# Patient Record
Sex: Female | Born: 1960
Health system: Southern US, Community
[De-identification: ages and names within clinical notes are randomized; demographics above are authoritative.]

## PROBLEM LIST (undated history)

## (undated) DIAGNOSIS — E119 Type 2 diabetes mellitus without complications: Secondary | ICD-10-CM

## (undated) DIAGNOSIS — F319 Bipolar disorder, unspecified: Secondary | ICD-10-CM

## (undated) DIAGNOSIS — I10 Essential (primary) hypertension: Secondary | ICD-10-CM

---

## 1997-09-18 ENCOUNTER — Emergency Department (HOSPITAL_COMMUNITY): Admission: EM | Admit: 1997-09-18 | Discharge: 1997-09-18 | Payer: Self-pay | Admitting: Emergency Medicine

## 1997-12-12 ENCOUNTER — Emergency Department (HOSPITAL_COMMUNITY): Admission: EM | Admit: 1997-12-12 | Discharge: 1997-12-12 | Payer: Self-pay | Admitting: Emergency Medicine

## 1997-12-21 ENCOUNTER — Encounter: Admission: RE | Admit: 1997-12-21 | Discharge: 1998-03-21 | Payer: Self-pay

## 1998-03-29 ENCOUNTER — Emergency Department (HOSPITAL_COMMUNITY): Admission: EM | Admit: 1998-03-29 | Discharge: 1998-03-29 | Payer: Self-pay | Admitting: Emergency Medicine

## 1998-03-29 ENCOUNTER — Encounter: Payer: Self-pay | Admitting: Emergency Medicine

## 1998-11-15 ENCOUNTER — Encounter: Admission: RE | Admit: 1998-11-15 | Discharge: 1998-11-15 | Payer: Self-pay | Admitting: Obstetrics

## 1998-11-15 ENCOUNTER — Other Ambulatory Visit: Admission: RE | Admit: 1998-11-15 | Discharge: 1998-11-15 | Payer: Self-pay | Admitting: *Deleted

## 1998-11-22 ENCOUNTER — Ambulatory Visit (HOSPITAL_COMMUNITY): Admission: RE | Admit: 1998-11-22 | Discharge: 1998-11-22 | Payer: Self-pay | Admitting: Obstetrics

## 1999-03-14 ENCOUNTER — Encounter: Payer: Self-pay | Admitting: Emergency Medicine

## 1999-03-14 ENCOUNTER — Emergency Department (HOSPITAL_COMMUNITY): Admission: EM | Admit: 1999-03-14 | Discharge: 1999-03-14 | Payer: Self-pay | Admitting: Emergency Medicine

## 1999-04-05 ENCOUNTER — Encounter: Admission: RE | Admit: 1999-04-05 | Discharge: 1999-04-05 | Payer: Self-pay | Admitting: Internal Medicine

## 1999-04-17 ENCOUNTER — Emergency Department (HOSPITAL_COMMUNITY): Admission: EM | Admit: 1999-04-17 | Discharge: 1999-04-17 | Payer: Self-pay | Admitting: Internal Medicine

## 1999-11-28 ENCOUNTER — Emergency Department (HOSPITAL_COMMUNITY): Admission: EM | Admit: 1999-11-28 | Discharge: 1999-11-29 | Payer: Self-pay | Admitting: Emergency Medicine

## 2000-02-08 ENCOUNTER — Emergency Department (HOSPITAL_COMMUNITY): Admission: EM | Admit: 2000-02-08 | Discharge: 2000-02-08 | Payer: Self-pay | Admitting: *Deleted

## 2000-06-28 ENCOUNTER — Emergency Department (HOSPITAL_COMMUNITY): Admission: EM | Admit: 2000-06-28 | Discharge: 2000-06-28 | Payer: Self-pay | Admitting: Emergency Medicine

## 2000-06-28 ENCOUNTER — Encounter: Payer: Self-pay | Admitting: Emergency Medicine

## 2001-02-23 ENCOUNTER — Encounter: Payer: Self-pay | Admitting: Internal Medicine

## 2001-02-23 ENCOUNTER — Encounter: Admission: RE | Admit: 2001-02-23 | Discharge: 2001-02-23 | Payer: Self-pay | Admitting: Internal Medicine

## 2004-06-04 ENCOUNTER — Emergency Department (HOSPITAL_COMMUNITY): Admission: EM | Admit: 2004-06-04 | Discharge: 2004-06-04 | Payer: Self-pay | Admitting: Emergency Medicine

## 2004-06-11 ENCOUNTER — Ambulatory Visit (HOSPITAL_COMMUNITY): Admission: RE | Admit: 2004-06-11 | Discharge: 2004-06-11 | Payer: Self-pay | Admitting: Emergency Medicine

## 2004-07-16 ENCOUNTER — Ambulatory Visit: Payer: Self-pay | Admitting: Family Medicine

## 2004-07-23 ENCOUNTER — Other Ambulatory Visit: Admission: RE | Admit: 2004-07-23 | Discharge: 2004-07-23 | Payer: Self-pay | Admitting: Family Medicine

## 2004-07-23 ENCOUNTER — Ambulatory Visit: Payer: Self-pay | Admitting: Family Medicine

## 2004-09-17 ENCOUNTER — Ambulatory Visit: Payer: Self-pay

## 2009-09-28 ENCOUNTER — Inpatient Hospital Stay (HOSPITAL_COMMUNITY): Admission: EM | Admit: 2009-09-28 | Discharge: 2009-10-05 | Payer: Self-pay | Admitting: Emergency Medicine

## 2009-10-04 ENCOUNTER — Ambulatory Visit: Payer: Self-pay | Admitting: Psychiatry

## 2009-10-05 ENCOUNTER — Inpatient Hospital Stay (HOSPITAL_COMMUNITY): Admission: AD | Admit: 2009-10-05 | Discharge: 2009-10-11 | Payer: Self-pay | Admitting: Psychiatry

## 2010-01-28 ENCOUNTER — Inpatient Hospital Stay (HOSPITAL_COMMUNITY): Admission: RE | Admit: 2010-01-28 | Discharge: 2010-02-13 | Payer: Self-pay | Admitting: Psychiatry

## 2010-01-28 ENCOUNTER — Ambulatory Visit: Payer: Self-pay | Admitting: Psychiatry

## 2010-03-07 ENCOUNTER — Encounter: Payer: Self-pay | Admitting: Internal Medicine

## 2010-03-16 ENCOUNTER — Emergency Department (HOSPITAL_COMMUNITY): Admission: EM | Admit: 2010-03-16 | Discharge: 2010-03-16 | Payer: Self-pay | Admitting: Emergency Medicine

## 2010-06-30 ENCOUNTER — Encounter: Payer: Self-pay | Admitting: Family Medicine

## 2010-07-09 NOTE — Miscellaneous (Signed)
Summary: [facility] C[facility]  [facility]ERTIFICATION   Imported By: Shon Hough 03/11/2010 15:09:01  _____________________________________________________________________  External Attachment:    Type:   Image     Comment:   External Document

## 2010-08-21 LAB — DIFFERENTIAL
Basophils Absolute: 0 10*3/uL (ref 0.0–0.1)
Basophils Relative: 0 % (ref 0–1)
Eosinophils Relative: 0 % (ref 0–5)
Monocytes Absolute: 1 10*3/uL (ref 0.1–1.0)
Monocytes Relative: 8 % (ref 3–12)

## 2010-08-21 LAB — RAPID URINE DRUG SCREEN, HOSP PERFORMED
Barbiturates: NOT DETECTED
Cocaine: NOT DETECTED
Opiates: NOT DETECTED

## 2010-08-21 LAB — CBC
HCT: 41 % (ref 36.0–46.0)
MCHC: 32 g/dL (ref 30.0–36.0)
MCV: 76.9 fL — ABNORMAL LOW (ref 78.0–100.0)
RDW: 16.9 % — ABNORMAL HIGH (ref 11.5–15.5)

## 2010-08-21 LAB — BASIC METABOLIC PANEL
BUN: 15 mg/dL (ref 6–23)
CO2: 26 mEq/L (ref 19–32)
Chloride: 103 mEq/L (ref 96–112)
Glucose, Bld: 110 mg/dL — ABNORMAL HIGH (ref 70–99)
Potassium: 4 mEq/L (ref 3.5–5.1)

## 2010-08-22 LAB — GLUCOSE, CAPILLARY
Glucose-Capillary: 101 mg/dL — ABNORMAL HIGH (ref 70–99)
Glucose-Capillary: 119 mg/dL — ABNORMAL HIGH (ref 70–99)
Glucose-Capillary: 131 mg/dL — ABNORMAL HIGH (ref 70–99)
Glucose-Capillary: 135 mg/dL — ABNORMAL HIGH (ref 70–99)
Glucose-Capillary: 141 mg/dL — ABNORMAL HIGH (ref 70–99)
Glucose-Capillary: 145 mg/dL — ABNORMAL HIGH (ref 70–99)
Glucose-Capillary: 148 mg/dL — ABNORMAL HIGH (ref 70–99)
Glucose-Capillary: 167 mg/dL — ABNORMAL HIGH (ref 70–99)
Glucose-Capillary: 176 mg/dL — ABNORMAL HIGH (ref 70–99)
Glucose-Capillary: 180 mg/dL — ABNORMAL HIGH (ref 70–99)
Glucose-Capillary: 237 mg/dL — ABNORMAL HIGH (ref 70–99)
Glucose-Capillary: 248 mg/dL — ABNORMAL HIGH (ref 70–99)

## 2010-08-23 LAB — URINE CULTURE: Special Requests: NEGATIVE

## 2010-08-23 LAB — COMPREHENSIVE METABOLIC PANEL
ALT: 8 U/L (ref 0–35)
ALT: 8 U/L (ref 0–35)
AST: 11 U/L (ref 0–37)
AST: 14 U/L (ref 0–37)
Albumin: 3.8 g/dL (ref 3.5–5.2)
Albumin: 4 g/dL (ref 3.5–5.2)
Alkaline Phosphatase: 69 U/L (ref 39–117)
Alkaline Phosphatase: 72 U/L (ref 39–117)
BUN: 14 mg/dL (ref 6–23)
Chloride: 101 mEq/L (ref 96–112)
Chloride: 102 mEq/L (ref 96–112)
GFR calc Af Amer: >60 mL/min (ref >60)
Potassium: 4.1 mEq/L (ref 3.5–5.1)
Potassium: 4.1 mEq/L (ref 3.5–5.1)
Sodium: 136 mEq/L (ref 135–145)
Sodium: 137 mEq/L (ref 135–145)
Total Bilirubin: 0.2 mg/dL — ABNORMAL LOW (ref 0.3–1.2)
Total Bilirubin: 0.6 mg/dL (ref 0.3–1.2)

## 2010-08-23 LAB — GLUCOSE, CAPILLARY
Glucose-Capillary: 100 mg/dL — ABNORMAL HIGH (ref 70–99)
Glucose-Capillary: 121 mg/dL — ABNORMAL HIGH (ref 70–99)
Glucose-Capillary: 131 mg/dL — ABNORMAL HIGH (ref 70–99)
Glucose-Capillary: 134 mg/dL — ABNORMAL HIGH (ref 70–99)
Glucose-Capillary: 142 mg/dL — ABNORMAL HIGH (ref 70–99)
Glucose-Capillary: 146 mg/dL — ABNORMAL HIGH (ref 70–99)
Glucose-Capillary: 148 mg/dL — ABNORMAL HIGH (ref 70–99)
Glucose-Capillary: 151 mg/dL — ABNORMAL HIGH (ref 70–99)
Glucose-Capillary: 165 mg/dL — ABNORMAL HIGH (ref 70–99)
Glucose-Capillary: 167 mg/dL — ABNORMAL HIGH (ref 70–99)
Glucose-Capillary: 194 mg/dL — ABNORMAL HIGH (ref 70–99)
Glucose-Capillary: 202 mg/dL — ABNORMAL HIGH (ref 70–99)
Glucose-Capillary: 204 mg/dL — ABNORMAL HIGH (ref 70–99)
Glucose-Capillary: 209 mg/dL — ABNORMAL HIGH (ref 70–99)
Glucose-Capillary: 213 mg/dL — ABNORMAL HIGH (ref 70–99)

## 2010-08-23 LAB — URINALYSIS, ROUTINE W REFLEX MICROSCOPIC
Glucose, UA: >1000 mg/dL — AB
Specific Gravity, Urine: 1.028 (ref 1.005–1.030)
Urobilinogen, UA: 0.2 mg/dL (ref 0.0–1.0)

## 2010-08-23 LAB — CBC
MCV: 76.7 fL — ABNORMAL LOW (ref 78.0–100.0)
MCV: 77.2 fL — ABNORMAL LOW (ref 78.0–100.0)
Platelets: 239 10*3/uL (ref 150–400)
Platelets: 293 10*3/uL (ref 150–400)
RBC: 5.42 MIL/uL — ABNORMAL HIGH (ref 3.87–5.11)
RBC: 5.48 MIL/uL — ABNORMAL HIGH (ref 3.87–5.11)
WBC: 11.9 10*3/uL — ABNORMAL HIGH (ref 4.0–10.5)
WBC: 14.1 10*3/uL — ABNORMAL HIGH (ref 4.0–10.5)

## 2010-08-23 LAB — URINE MICROSCOPIC-ADD ON

## 2010-08-27 LAB — COMPREHENSIVE METABOLIC PANEL
ALT: 8 U/L (ref 0–35)
ALT: <8 U/L (ref 0–35)
BUN: 2 mg/dL — ABNORMAL LOW (ref 6–23)
CO2: 25 mEq/L (ref 19–32)
CO2: 28 mEq/L (ref 19–32)
Calcium: 7.3 mg/dL — ABNORMAL LOW (ref 8.4–10.5)
Calcium: 8.8 mg/dL (ref 8.4–10.5)
Creatinine, Ser: 0.64 mg/dL (ref 0.4–1.2)
Creatinine, Ser: 0.86 mg/dL (ref 0.4–1.2)
GFR calc non Af Amer: >60 mL/min (ref >60)
GFR calc non Af Amer: >60 mL/min (ref >60)
Glucose, Bld: 90 mg/dL (ref 70–99)
Glucose, Bld: 99 mg/dL (ref 70–99)
Sodium: 141 mEq/L (ref 135–145)
Total Bilirubin: 0.4 mg/dL (ref 0.3–1.2)

## 2010-08-27 LAB — BASIC METABOLIC PANEL
BUN: 4 mg/dL — ABNORMAL LOW (ref 6–23)
CO2: 26 mEq/L (ref 19–32)
CO2: 31 mEq/L (ref 19–32)
Calcium: 9.1 mg/dL (ref 8.4–10.5)
Chloride: 105 mEq/L (ref 96–112)
Chloride: 112 mEq/L (ref 96–112)
GFR calc Af Amer: >60 mL/min (ref >60)
GFR calc Af Amer: >60 mL/min (ref >60)
GFR calc non Af Amer: >60 mL/min (ref >60)
Glucose, Bld: 175 mg/dL — ABNORMAL HIGH (ref 70–99)
Glucose, Bld: 76 mg/dL (ref 70–99)
Potassium: 3.5 mEq/L (ref 3.5–5.1)
Potassium: 4 mEq/L (ref 3.5–5.1)
Sodium: 139 mEq/L (ref 135–145)
Sodium: 144 mEq/L (ref 135–145)

## 2010-08-27 LAB — CBC
HCT: 39.5 % (ref 36.0–46.0)
HCT: 40.2 % (ref 36.0–46.0)
Hemoglobin: 10.3 g/dL — ABNORMAL LOW (ref 12.0–15.0)
Hemoglobin: 11.2 g/dL — ABNORMAL LOW (ref 12.0–15.0)
Hemoglobin: 12.5 g/dL (ref 12.0–15.0)
Hemoglobin: 12.6 g/dL (ref 12.0–15.0)
MCHC: 31.1 g/dL (ref 30.0–36.0)
MCHC: 31.2 g/dL (ref 30.0–36.0)
MCHC: 31.9 g/dL (ref 30.0–36.0)
MCV: 80.7 fL (ref 78.0–100.0)
MCV: 81 fL (ref 78.0–100.0)
RBC: 4.07 MIL/uL (ref 3.87–5.11)
RBC: 4.28 MIL/uL (ref 3.87–5.11)
RBC: 4.95 MIL/uL (ref 3.87–5.11)
RDW: 15.8 % — ABNORMAL HIGH (ref 11.5–15.5)
RDW: 16 % — ABNORMAL HIGH (ref 11.5–15.5)
WBC: 9.8 10*3/uL (ref 4.0–10.5)

## 2010-08-27 LAB — DIFFERENTIAL
Basophils Absolute: 0 10*3/uL (ref 0.0–0.1)
Basophils Relative: 0 % (ref 0–1)
Eosinophils Absolute: 0.2 10*3/uL (ref 0.0–0.7)
Eosinophils Relative: 2 % (ref 0–5)
Lymphs Abs: 3.5 10*3/uL (ref 0.7–4.0)
Monocytes Absolute: 1.2 10*3/uL — ABNORMAL HIGH (ref 0.1–1.0)
Monocytes Relative: 8 % (ref 3–12)
Neutrophils Relative %: 63 % (ref 43–77)
Neutrophils Relative %: 68 % (ref 43–77)

## 2010-08-27 LAB — LIPASE, BLOOD: Lipase: 21 U/L (ref 11–59)

## 2010-08-27 LAB — TSH: TSH: 1.228 u[IU]/mL (ref 0.350–4.500)

## 2010-08-27 LAB — URINE CULTURE
Colony Count: NO GROWTH
Culture: NO GROWTH
Special Requests: NEGATIVE

## 2010-08-27 LAB — VALPROIC ACID LEVEL
Valproic Acid Lvl: 104.2 ug/mL — ABNORMAL HIGH (ref 50.0–100.0)
Valproic Acid Lvl: 52.6 ug/mL (ref 50.0–100.0)

## 2010-08-27 LAB — LITHIUM LEVEL
Lithium Lvl: 0.45 mEq/L — ABNORMAL LOW (ref 0.80–1.40)
Lithium Lvl: 0.86 mEq/L (ref 0.80–1.40)
Lithium Lvl: 1.5 mEq/L — ABNORMAL HIGH (ref 0.80–1.40)

## 2010-08-27 LAB — RAPID URINE DRUG SCREEN, HOSP PERFORMED
Benzodiazepines: POSITIVE — AB
Cocaine: NOT DETECTED
Opiates: NOT DETECTED

## 2010-08-27 LAB — AMMONIA: Ammonia: 25 umol/L (ref 11–35)

## 2011-09-02 DIAGNOSIS — E119 Type 2 diabetes mellitus without complications: Secondary | ICD-10-CM | POA: Diagnosis not present

## 2011-09-02 DIAGNOSIS — E785 Hyperlipidemia, unspecified: Secondary | ICD-10-CM | POA: Diagnosis not present

## 2011-09-02 DIAGNOSIS — I10 Essential (primary) hypertension: Secondary | ICD-10-CM | POA: Diagnosis not present

## 2011-12-31 DIAGNOSIS — IMO0001 Reserved for inherently not codable concepts without codable children: Secondary | ICD-10-CM | POA: Diagnosis not present

## 2011-12-31 DIAGNOSIS — E119 Type 2 diabetes mellitus without complications: Secondary | ICD-10-CM | POA: Diagnosis not present

## 2011-12-31 DIAGNOSIS — I10 Essential (primary) hypertension: Secondary | ICD-10-CM | POA: Diagnosis not present

## 2011-12-31 DIAGNOSIS — E785 Hyperlipidemia, unspecified: Secondary | ICD-10-CM | POA: Diagnosis not present

## 2012-04-27 DIAGNOSIS — E119 Type 2 diabetes mellitus without complications: Secondary | ICD-10-CM | POA: Diagnosis not present

## 2012-04-27 DIAGNOSIS — I10 Essential (primary) hypertension: Secondary | ICD-10-CM | POA: Diagnosis not present

## 2012-04-27 DIAGNOSIS — E785 Hyperlipidemia, unspecified: Secondary | ICD-10-CM | POA: Diagnosis not present

## 2012-05-12 DIAGNOSIS — E119 Type 2 diabetes mellitus without complications: Secondary | ICD-10-CM | POA: Diagnosis not present

## 2012-05-12 DIAGNOSIS — I10 Essential (primary) hypertension: Secondary | ICD-10-CM | POA: Diagnosis not present

## 2012-06-15 DIAGNOSIS — N888 Other specified noninflammatory disorders of cervix uteri: Secondary | ICD-10-CM | POA: Diagnosis not present

## 2012-06-15 DIAGNOSIS — Z1151 Encounter for screening for human papillomavirus (HPV): Secondary | ICD-10-CM | POA: Diagnosis not present

## 2012-06-15 DIAGNOSIS — E119 Type 2 diabetes mellitus without complications: Secondary | ICD-10-CM | POA: Diagnosis not present

## 2012-06-15 DIAGNOSIS — Z124 Encounter for screening for malignant neoplasm of cervix: Secondary | ICD-10-CM | POA: Diagnosis not present

## 2012-06-15 DIAGNOSIS — Z0000 Encounter for general adult medical examination without abnormal findings: Secondary | ICD-10-CM | POA: Diagnosis not present

## 2012-07-16 DIAGNOSIS — Z1231 Encounter for screening mammogram for malignant neoplasm of breast: Secondary | ICD-10-CM | POA: Diagnosis not present

## 2012-09-21 DIAGNOSIS — I10 Essential (primary) hypertension: Secondary | ICD-10-CM | POA: Diagnosis not present

## 2012-09-21 DIAGNOSIS — E785 Hyperlipidemia, unspecified: Secondary | ICD-10-CM | POA: Diagnosis not present

## 2012-11-29 DIAGNOSIS — F309 Manic episode, unspecified: Secondary | ICD-10-CM | POA: Diagnosis not present

## 2013-01-03 DIAGNOSIS — E119 Type 2 diabetes mellitus without complications: Secondary | ICD-10-CM | POA: Diagnosis not present

## 2013-02-03 DIAGNOSIS — F319 Bipolar disorder, unspecified: Secondary | ICD-10-CM | POA: Diagnosis not present

## 2013-02-03 DIAGNOSIS — E1159 Type 2 diabetes mellitus with other circulatory complications: Secondary | ICD-10-CM | POA: Diagnosis not present

## 2013-02-03 DIAGNOSIS — K5900 Constipation, unspecified: Secondary | ICD-10-CM | POA: Diagnosis not present

## 2013-02-03 DIAGNOSIS — F329 Major depressive disorder, single episode, unspecified: Secondary | ICD-10-CM | POA: Diagnosis not present

## 2013-02-03 DIAGNOSIS — E669 Obesity, unspecified: Secondary | ICD-10-CM | POA: Diagnosis not present

## 2013-02-03 DIAGNOSIS — I10 Essential (primary) hypertension: Secondary | ICD-10-CM | POA: Diagnosis not present

## 2013-02-03 DIAGNOSIS — H913 Deaf nonspeaking, not elsewhere classified: Secondary | ICD-10-CM | POA: Diagnosis not present

## 2013-02-09 DIAGNOSIS — F329 Major depressive disorder, single episode, unspecified: Secondary | ICD-10-CM | POA: Diagnosis not present

## 2013-02-09 DIAGNOSIS — I10 Essential (primary) hypertension: Secondary | ICD-10-CM | POA: Diagnosis not present

## 2013-02-15 ENCOUNTER — Ambulatory Visit: Payer: Self-pay

## 2013-02-15 DIAGNOSIS — E559 Vitamin D deficiency, unspecified: Secondary | ICD-10-CM | POA: Diagnosis not present

## 2013-02-15 DIAGNOSIS — I119 Hypertensive heart disease without heart failure: Secondary | ICD-10-CM | POA: Diagnosis not present

## 2013-02-15 DIAGNOSIS — Z0189 Encounter for other specified special examinations: Secondary | ICD-10-CM | POA: Diagnosis not present

## 2013-02-15 DIAGNOSIS — E119 Type 2 diabetes mellitus without complications: Secondary | ICD-10-CM | POA: Diagnosis not present

## 2013-02-15 DIAGNOSIS — I10 Essential (primary) hypertension: Secondary | ICD-10-CM | POA: Diagnosis not present

## 2013-02-15 DIAGNOSIS — E785 Hyperlipidemia, unspecified: Secondary | ICD-10-CM | POA: Diagnosis not present

## 2013-02-16 DIAGNOSIS — F259 Schizoaffective disorder, unspecified: Secondary | ICD-10-CM | POA: Diagnosis not present

## 2013-02-22 DIAGNOSIS — E559 Vitamin D deficiency, unspecified: Secondary | ICD-10-CM | POA: Diagnosis not present

## 2013-02-22 DIAGNOSIS — H913 Deaf nonspeaking, not elsewhere classified: Secondary | ICD-10-CM | POA: Diagnosis not present

## 2013-02-22 DIAGNOSIS — E785 Hyperlipidemia, unspecified: Secondary | ICD-10-CM | POA: Diagnosis not present

## 2013-02-22 DIAGNOSIS — I10 Essential (primary) hypertension: Secondary | ICD-10-CM | POA: Diagnosis not present

## 2013-02-22 DIAGNOSIS — F319 Bipolar disorder, unspecified: Secondary | ICD-10-CM | POA: Diagnosis not present

## 2013-02-22 DIAGNOSIS — Z23 Encounter for immunization: Secondary | ICD-10-CM | POA: Diagnosis not present

## 2013-02-22 DIAGNOSIS — E669 Obesity, unspecified: Secondary | ICD-10-CM | POA: Diagnosis not present

## 2013-02-22 DIAGNOSIS — E119 Type 2 diabetes mellitus without complications: Secondary | ICD-10-CM | POA: Diagnosis not present

## 2013-03-18 DIAGNOSIS — I119 Hypertensive heart disease without heart failure: Secondary | ICD-10-CM | POA: Diagnosis not present

## 2013-03-18 DIAGNOSIS — R9431 Abnormal electrocardiogram [ECG] [EKG]: Secondary | ICD-10-CM | POA: Diagnosis not present

## 2013-04-12 DIAGNOSIS — Z79899 Other long term (current) drug therapy: Secondary | ICD-10-CM | POA: Diagnosis not present

## 2013-04-20 DIAGNOSIS — F259 Schizoaffective disorder, unspecified: Secondary | ICD-10-CM | POA: Diagnosis not present

## 2013-05-02 DIAGNOSIS — F259 Schizoaffective disorder, unspecified: Secondary | ICD-10-CM | POA: Diagnosis not present

## 2013-05-09 DIAGNOSIS — F329 Major depressive disorder, single episode, unspecified: Secondary | ICD-10-CM | POA: Diagnosis not present

## 2013-05-09 DIAGNOSIS — E669 Obesity, unspecified: Secondary | ICD-10-CM | POA: Diagnosis not present

## 2013-05-09 DIAGNOSIS — F319 Bipolar disorder, unspecified: Secondary | ICD-10-CM | POA: Diagnosis not present

## 2013-05-09 DIAGNOSIS — H913 Deaf nonspeaking, not elsewhere classified: Secondary | ICD-10-CM | POA: Diagnosis not present

## 2013-05-09 DIAGNOSIS — E119 Type 2 diabetes mellitus without complications: Secondary | ICD-10-CM | POA: Diagnosis not present

## 2013-05-09 DIAGNOSIS — E559 Vitamin D deficiency, unspecified: Secondary | ICD-10-CM | POA: Diagnosis not present

## 2013-05-09 DIAGNOSIS — I10 Essential (primary) hypertension: Secondary | ICD-10-CM | POA: Diagnosis not present

## 2013-05-09 DIAGNOSIS — E785 Hyperlipidemia, unspecified: Secondary | ICD-10-CM | POA: Diagnosis not present

## 2013-05-16 DIAGNOSIS — F259 Schizoaffective disorder, unspecified: Secondary | ICD-10-CM | POA: Diagnosis not present

## 2013-05-30 DIAGNOSIS — F259 Schizoaffective disorder, unspecified: Secondary | ICD-10-CM | POA: Diagnosis not present

## 2013-06-27 DIAGNOSIS — F259 Schizoaffective disorder, unspecified: Secondary | ICD-10-CM | POA: Diagnosis not present

## 2013-07-14 DIAGNOSIS — G4733 Obstructive sleep apnea (adult) (pediatric): Secondary | ICD-10-CM | POA: Diagnosis not present

## 2013-07-14 DIAGNOSIS — F259 Schizoaffective disorder, unspecified: Secondary | ICD-10-CM | POA: Diagnosis not present

## 2013-07-25 DIAGNOSIS — F259 Schizoaffective disorder, unspecified: Secondary | ICD-10-CM | POA: Diagnosis not present

## 2013-08-11 DIAGNOSIS — F259 Schizoaffective disorder, unspecified: Secondary | ICD-10-CM | POA: Diagnosis not present

## 2013-08-18 DIAGNOSIS — H913 Deaf nonspeaking, not elsewhere classified: Secondary | ICD-10-CM | POA: Diagnosis not present

## 2013-08-18 DIAGNOSIS — K5900 Constipation, unspecified: Secondary | ICD-10-CM | POA: Diagnosis not present

## 2013-08-18 DIAGNOSIS — Z0000 Encounter for general adult medical examination without abnormal findings: Secondary | ICD-10-CM | POA: Diagnosis not present

## 2013-08-18 DIAGNOSIS — F3289 Other specified depressive episodes: Secondary | ICD-10-CM | POA: Diagnosis not present

## 2013-08-18 DIAGNOSIS — F329 Major depressive disorder, single episode, unspecified: Secondary | ICD-10-CM | POA: Diagnosis not present

## 2013-08-18 DIAGNOSIS — F319 Bipolar disorder, unspecified: Secondary | ICD-10-CM | POA: Diagnosis not present

## 2013-08-18 DIAGNOSIS — E559 Vitamin D deficiency, unspecified: Secondary | ICD-10-CM | POA: Diagnosis not present

## 2013-08-18 DIAGNOSIS — E119 Type 2 diabetes mellitus without complications: Secondary | ICD-10-CM | POA: Diagnosis not present

## 2013-08-18 DIAGNOSIS — E669 Obesity, unspecified: Secondary | ICD-10-CM | POA: Diagnosis not present

## 2013-08-18 DIAGNOSIS — I10 Essential (primary) hypertension: Secondary | ICD-10-CM | POA: Diagnosis not present

## 2013-08-18 DIAGNOSIS — E785 Hyperlipidemia, unspecified: Secondary | ICD-10-CM | POA: Diagnosis not present

## 2013-08-22 DIAGNOSIS — F259 Schizoaffective disorder, unspecified: Secondary | ICD-10-CM | POA: Diagnosis not present

## 2013-09-05 DIAGNOSIS — F259 Schizoaffective disorder, unspecified: Secondary | ICD-10-CM | POA: Diagnosis not present

## 2013-09-19 DIAGNOSIS — F259 Schizoaffective disorder, unspecified: Secondary | ICD-10-CM | POA: Diagnosis not present

## 2013-09-26 DIAGNOSIS — F259 Schizoaffective disorder, unspecified: Secondary | ICD-10-CM | POA: Diagnosis not present

## 2013-10-03 DIAGNOSIS — F259 Schizoaffective disorder, unspecified: Secondary | ICD-10-CM | POA: Diagnosis not present

## 2013-10-14 DIAGNOSIS — F259 Schizoaffective disorder, unspecified: Secondary | ICD-10-CM | POA: Diagnosis not present

## 2013-10-17 DIAGNOSIS — F259 Schizoaffective disorder, unspecified: Secondary | ICD-10-CM | POA: Diagnosis not present

## 2013-10-24 DIAGNOSIS — F259 Schizoaffective disorder, unspecified: Secondary | ICD-10-CM | POA: Diagnosis not present

## 2013-11-07 DIAGNOSIS — F259 Schizoaffective disorder, unspecified: Secondary | ICD-10-CM | POA: Diagnosis not present

## 2013-11-14 DIAGNOSIS — E559 Vitamin D deficiency, unspecified: Secondary | ICD-10-CM | POA: Diagnosis not present

## 2013-11-14 DIAGNOSIS — E785 Hyperlipidemia, unspecified: Secondary | ICD-10-CM | POA: Diagnosis not present

## 2013-11-14 DIAGNOSIS — E119 Type 2 diabetes mellitus without complications: Secondary | ICD-10-CM | POA: Diagnosis not present

## 2013-11-14 DIAGNOSIS — I10 Essential (primary) hypertension: Secondary | ICD-10-CM | POA: Diagnosis not present

## 2013-11-14 DIAGNOSIS — H913 Deaf nonspeaking, not elsewhere classified: Secondary | ICD-10-CM | POA: Diagnosis not present

## 2013-11-14 DIAGNOSIS — F319 Bipolar disorder, unspecified: Secondary | ICD-10-CM | POA: Diagnosis not present

## 2013-11-14 DIAGNOSIS — F3289 Other specified depressive episodes: Secondary | ICD-10-CM | POA: Diagnosis not present

## 2013-11-14 DIAGNOSIS — F329 Major depressive disorder, single episode, unspecified: Secondary | ICD-10-CM | POA: Diagnosis not present

## 2013-11-14 DIAGNOSIS — E669 Obesity, unspecified: Secondary | ICD-10-CM | POA: Diagnosis not present

## 2013-11-21 DIAGNOSIS — F259 Schizoaffective disorder, unspecified: Secondary | ICD-10-CM | POA: Diagnosis not present

## 2013-11-22 DIAGNOSIS — F259 Schizoaffective disorder, unspecified: Secondary | ICD-10-CM | POA: Diagnosis not present

## 2013-12-05 DIAGNOSIS — F259 Schizoaffective disorder, unspecified: Secondary | ICD-10-CM | POA: Diagnosis not present

## 2013-12-19 DIAGNOSIS — F259 Schizoaffective disorder, unspecified: Secondary | ICD-10-CM | POA: Diagnosis not present

## 2014-01-02 DIAGNOSIS — F259 Schizoaffective disorder, unspecified: Secondary | ICD-10-CM | POA: Diagnosis not present

## 2014-01-09 DIAGNOSIS — F259 Schizoaffective disorder, unspecified: Secondary | ICD-10-CM | POA: Diagnosis not present

## 2014-01-23 DIAGNOSIS — F259 Schizoaffective disorder, unspecified: Secondary | ICD-10-CM | POA: Diagnosis not present

## 2014-02-06 DIAGNOSIS — F259 Schizoaffective disorder, unspecified: Secondary | ICD-10-CM | POA: Diagnosis not present

## 2014-02-20 DIAGNOSIS — F259 Schizoaffective disorder, unspecified: Secondary | ICD-10-CM | POA: Diagnosis not present

## 2014-02-28 DIAGNOSIS — F259 Schizoaffective disorder, unspecified: Secondary | ICD-10-CM | POA: Diagnosis not present

## 2014-03-07 DIAGNOSIS — F319 Bipolar disorder, unspecified: Secondary | ICD-10-CM | POA: Diagnosis not present

## 2014-03-07 DIAGNOSIS — Z23 Encounter for immunization: Secondary | ICD-10-CM | POA: Diagnosis not present

## 2014-03-07 DIAGNOSIS — E785 Hyperlipidemia, unspecified: Secondary | ICD-10-CM | POA: Diagnosis not present

## 2014-03-07 DIAGNOSIS — I10 Essential (primary) hypertension: Secondary | ICD-10-CM | POA: Diagnosis not present

## 2014-03-07 DIAGNOSIS — E669 Obesity, unspecified: Secondary | ICD-10-CM | POA: Diagnosis not present

## 2014-03-07 DIAGNOSIS — E559 Vitamin D deficiency, unspecified: Secondary | ICD-10-CM | POA: Diagnosis not present

## 2014-03-07 DIAGNOSIS — H913 Deaf nonspeaking, not elsewhere classified: Secondary | ICD-10-CM | POA: Diagnosis not present

## 2014-03-07 DIAGNOSIS — K5900 Constipation, unspecified: Secondary | ICD-10-CM | POA: Diagnosis not present

## 2014-03-07 DIAGNOSIS — F3289 Other specified depressive episodes: Secondary | ICD-10-CM | POA: Diagnosis not present

## 2014-03-07 DIAGNOSIS — E1110 Type 2 diabetes mellitus with ketoacidosis without coma: Secondary | ICD-10-CM | POA: Diagnosis not present

## 2014-03-07 DIAGNOSIS — E119 Type 2 diabetes mellitus without complications: Secondary | ICD-10-CM | POA: Diagnosis not present

## 2014-03-07 DIAGNOSIS — F329 Major depressive disorder, single episode, unspecified: Secondary | ICD-10-CM | POA: Diagnosis not present

## 2014-04-17 ENCOUNTER — Ambulatory Visit: Payer: Self-pay | Admitting: Internal Medicine

## 2014-04-18 ENCOUNTER — Ambulatory Visit: Payer: Self-pay | Admitting: Internal Medicine

## 2014-05-22 ENCOUNTER — Inpatient Hospital Stay (HOSPITAL_COMMUNITY)
Admission: EM | Admit: 2014-05-22 | Discharge: 2014-05-26 | DRG: 193 | Disposition: A | Discharge status: Home / Self Care | Payer: Medicare Other | Attending: Internal Medicine | Admitting: Internal Medicine

## 2014-05-22 ENCOUNTER — Encounter (HOSPITAL_COMMUNITY): Payer: Self-pay | Admitting: Emergency Medicine

## 2014-05-22 ENCOUNTER — Emergency Department (HOSPITAL_COMMUNITY): Payer: Medicare Other

## 2014-05-22 DIAGNOSIS — R05 Cough: Secondary | ICD-10-CM | POA: Diagnosis not present

## 2014-05-22 DIAGNOSIS — J9601 Acute respiratory failure with hypoxia: Secondary | ICD-10-CM | POA: Diagnosis not present

## 2014-05-22 DIAGNOSIS — J189 Pneumonia, unspecified organism: Principal | ICD-10-CM | POA: Diagnosis present

## 2014-05-22 DIAGNOSIS — Z794 Long term (current) use of insulin: Secondary | ICD-10-CM | POA: Diagnosis not present

## 2014-05-22 DIAGNOSIS — E119 Type 2 diabetes mellitus without complications: Secondary | ICD-10-CM | POA: Diagnosis present

## 2014-05-22 DIAGNOSIS — D696 Thrombocytopenia, unspecified: Secondary | ICD-10-CM | POA: Diagnosis present

## 2014-05-22 DIAGNOSIS — I10 Essential (primary) hypertension: Secondary | ICD-10-CM | POA: Diagnosis present

## 2014-05-22 DIAGNOSIS — R0902 Hypoxemia: Secondary | ICD-10-CM | POA: Diagnosis present

## 2014-05-22 DIAGNOSIS — H9190 Unspecified hearing loss, unspecified ear: Secondary | ICD-10-CM | POA: Diagnosis present

## 2014-05-22 DIAGNOSIS — R918 Other nonspecific abnormal finding of lung field: Secondary | ICD-10-CM | POA: Diagnosis not present

## 2014-05-22 DIAGNOSIS — D509 Iron deficiency anemia, unspecified: Secondary | ICD-10-CM | POA: Diagnosis present

## 2014-05-22 DIAGNOSIS — F319 Bipolar disorder, unspecified: Secondary | ICD-10-CM | POA: Diagnosis present

## 2014-05-22 DIAGNOSIS — R791 Abnormal coagulation profile: Secondary | ICD-10-CM | POA: Diagnosis not present

## 2014-05-22 HISTORY — DX: Bipolar disorder, unspecified: F31.9

## 2014-05-22 HISTORY — DX: Essential (primary) hypertension: I10

## 2014-05-22 HISTORY — DX: Type 2 diabetes mellitus without complications: E11.9

## 2014-05-22 LAB — URINALYSIS, ROUTINE W REFLEX MICROSCOPIC
Glucose, UA: 100 mg/dL — AB
Hgb urine dipstick: NEGATIVE
Ketones, ur: NEGATIVE mg/dL
Leukocytes, UA: NEGATIVE
NITRITE: NEGATIVE
Protein, ur: NEGATIVE mg/dL
SPECIFIC GRAVITY, URINE: 1.023 (ref 1.005–1.030)
UROBILINOGEN UA: 2 mg/dL — AB (ref 0.0–1.0)
pH: 6 (ref 5.0–8.0)

## 2014-05-22 LAB — COMPREHENSIVE METABOLIC PANEL
ALBUMIN: 3.4 g/dL — AB (ref 3.5–5.2)
ALT: <5 U/L (ref 0–35)
AST: 9 U/L (ref 0–37)
Alkaline Phosphatase: 51 U/L (ref 39–117)
Anion gap: 16 — ABNORMAL HIGH (ref 5–15)
BUN: 13 mg/dL (ref 6–23)
CALCIUM: 9.3 mg/dL (ref 8.4–10.5)
CHLORIDE: 97 meq/L (ref 96–112)
CO2: 24 mEq/L (ref 19–32)
CREATININE: 0.97 mg/dL (ref 0.50–1.10)
GFR calc Af Amer: 75 mL/min — ABNORMAL LOW (ref >90)
GFR calc non Af Amer: 65 mL/min — ABNORMAL LOW (ref >90)
Glucose, Bld: 151 mg/dL — ABNORMAL HIGH (ref 70–99)
Potassium: 4.2 mEq/L (ref 3.7–5.3)
Sodium: 137 mEq/L (ref 137–147)
TOTAL PROTEIN: 7.7 g/dL (ref 6.0–8.3)
Total Bilirubin: 0.2 mg/dL — ABNORMAL LOW (ref 0.3–1.2)

## 2014-05-22 LAB — CBC WITH DIFFERENTIAL/PLATELET
BASOS ABS: 0 10*3/uL (ref 0.0–0.1)
BASOS PCT: 0 % (ref 0–1)
EOS PCT: 0 % (ref 0–5)
Eosinophils Absolute: 0 10*3/uL (ref 0.0–0.7)
HEMATOCRIT: 36.8 % (ref 36.0–46.0)
Hemoglobin: 11.7 g/dL — ABNORMAL LOW (ref 12.0–15.0)
Lymphocytes Relative: 18 % (ref 12–46)
Lymphs Abs: 3 10*3/uL (ref 0.7–4.0)
MCH: 24.7 pg — ABNORMAL LOW (ref 26.0–34.0)
MCHC: 31.8 g/dL (ref 30.0–36.0)
MCV: 77.8 fL — ABNORMAL LOW (ref 78.0–100.0)
MONO ABS: 2.9 10*3/uL — AB (ref 0.1–1.0)
Monocytes Relative: 17 % — ABNORMAL HIGH (ref 3–12)
NEUTROS ABS: 11 10*3/uL — AB (ref 1.7–7.7)
Neutrophils Relative %: 65 % (ref 43–77)
Platelets: 147 10*3/uL — ABNORMAL LOW (ref 150–400)
RBC: 4.73 MIL/uL (ref 3.87–5.11)
RDW: 16 % — AB (ref 11.5–15.5)
WBC: 16.8 10*3/uL — ABNORMAL HIGH (ref 4.0–10.5)

## 2014-05-22 LAB — TROPONIN I

## 2014-05-22 LAB — I-STAT CG4 LACTIC ACID, ED: LACTIC ACID, VENOUS: 2.51 mmol/L — AB (ref 0.5–2.2)

## 2014-05-22 LAB — D-DIMER, QUANTITATIVE (NOT AT ARMC): D DIMER QUANT: 0.83 ug{FEU}/mL — AB (ref 0.00–0.48)

## 2014-05-22 MED ORDER — SODIUM CHLORIDE 0.9 % IV BOLUS (SEPSIS)
1000.0000 mL | Freq: Once | INTRAVENOUS | Status: AC
  Administered 2014-05-22: 1000 mL via INTRAVENOUS

## 2014-05-22 MED ORDER — DEXTROSE 5 % IV SOLN
500.0000 mg | Freq: Once | INTRAVENOUS | Status: AC
  Administered 2014-05-23: 500 mg via INTRAVENOUS
  Filled 2014-05-22: qty 500

## 2014-05-22 MED ORDER — MORPHINE SULFATE 4 MG/ML IJ SOLN
4.0000 mg | Freq: Once | INTRAMUSCULAR | Status: AC
  Administered 2014-05-22: 4 mg via INTRAVENOUS
  Filled 2014-05-22: qty 1

## 2014-05-22 MED ORDER — DEXTROSE 5 % IV SOLN
1.0000 g | Freq: Once | INTRAVENOUS | Status: AC
  Administered 2014-05-22: 1 g via INTRAVENOUS
  Filled 2014-05-22: qty 10

## 2014-05-22 MED ORDER — IOHEXOL 350 MG/ML SOLN
100.0000 mL | Freq: Once | INTRAVENOUS | Status: AC | PRN
  Administered 2014-05-22: 100 mL via INTRAVENOUS

## 2014-05-22 NOTE — ED Provider Notes (Signed)
CSN: [id number]     Arrival date & time 05/22/14  1911 History   First MD Initiated Contact with Patient 05/22/14 1938     Chief Complaint  Patient presents with  . Back Pain     (Consider location/radiation/quality/duration/timing/severity/associated sxs/prior Treatment) HPI  53 year old female presents with right upper back pain for the past 2 days. The patient is deaf and the history is taken through a family member who translates. For the past 24 hours she's been having a nonproductive cough chest pain while coughing. The back pain is constant. Nothing makes the pain better or worse. Denies any hemoptysis or leg swelling. No prior history of blood clots. Patient rates the pain as a 4 out of 10. She's not taken anything for the pain. Denies any abdominal pain. No dysuria or hematuria. She has been having urinary frequency. No vomiting, diarrhea, or constipation.  Past Medical History  Diagnosis Date  . Diabetes mellitus without complication   . Bipolar disorder   . Hypertension    History reviewed. No pertinent past surgical history. Family History  Problem Relation Age of Onset  . Hypertension Mother   . Diabetes Mother   . Hypertension Father   . Diabetes Father   . Cancer Sister   . Cancer Brother   . Diabetes Other    History  Substance Use Topics  . Smoking status: Never Smoker   . Smokeless tobacco: Not on file  . Alcohol Use: Yes     Comment: rare   OB History    No data available     Review of Systems  Constitutional: Negative for fever.  Respiratory: Positive for cough. Negative for shortness of breath.   Cardiovascular: Positive for chest pain. Negative for leg swelling.  Gastrointestinal: Negative for nausea, vomiting and abdominal pain.  Genitourinary: Positive for frequency. Negative for dysuria and hematuria.  Musculoskeletal: Positive for back pain.  All other systems reviewed and are negative.     Allergies  Review of patient's allergies  indicates no known allergies.  Home Medications   Prior to Admission medications   Medication Sig Start Date End Date Taking? Authorizing Provider  buPROPion (WELLBUTRIN XL) 300 MG 24 hr tablet Take 300 mg by mouth at bedtime.   Yes Historical Provider, MD  Cholecalciferol (VITAMIN D3) 5000 UNITS CAPS Take 1 capsule by mouth daily.   Yes Historical Provider, MD  divalproex (DEPAKOTE) 250 MG DR tablet Take 250 mg by mouth 2 (two) times daily.   Yes Historical Provider, MD  docusate sodium (STOOL SOFTENER) 100 MG capsule Take 100 mg by mouth daily.   Yes Historical Provider, MD  haloperidol (HALDOL) 5 MG tablet Take 5 mg by mouth at bedtime.   Yes Historical Provider, MD  insulin aspart protamine- aspart (NOVOLOG MIX 70/30) (70-30) 100 UNIT/ML injection Inject 25 Units into the skin 3 (three) times daily.   Yes Historical Provider, MD  lisinopril (PRINIVIL,ZESTRIL) 20 MG tablet Take 20 mg by mouth daily.   Yes Historical Provider, MD  rosuvastatin (CRESTOR) 10 MG tablet Take 10 mg by mouth at bedtime.   Yes Historical Provider, MD   BP 124/74 mmHg  Pulse 111  Temp(Src) 98.3 F (36.8 C) (Oral)  Resp 16  SpO2 99% Physical Exam  Constitutional: She is oriented to person, place, and time. She appears well-developed and well-nourished.  HENT:  Head: Normocephalic and atraumatic.  Right Ear: External ear normal.  Left Ear: External ear normal.  Nose: Nose normal.  Eyes: Right  eye exhibits no discharge. Left eye exhibits no discharge.  Cardiovascular: Regular rhythm and normal heart sounds.  Tachycardia present.   Pulmonary/Chest: Effort normal and breath sounds normal. She has no wheezes. She has no rales.  Abdominal: Soft. There is no tenderness. There is no CVA tenderness.  Musculoskeletal:       Cervical back: She exhibits tenderness. She exhibits no bony tenderness.       Thoracic back: She exhibits no tenderness and no bony tenderness.       Lumbar back: She exhibits no tenderness and  no bony tenderness.       Back:  Neurological: She is alert and oriented to person, place, and time.  Skin: Skin is warm and dry.  Nursing note and vitals reviewed.   ED Course  Procedures (including critical care time) Labs Review Labs Reviewed  URINALYSIS, ROUTINE W REFLEX MICROSCOPIC - Abnormal; Notable for the following:    Color, Urine AMBER (*)    Glucose, UA 100 (*)    Bilirubin Urine SMALL (*)    Urobilinogen, UA 2.0 (*)    All other components within normal limits  D-DIMER, QUANTITATIVE - Abnormal; Notable for the following:    D-Dimer, Quant 0.83 (*)    All other components within normal limits  CBC WITH DIFFERENTIAL - Abnormal; Notable for the following:    WBC 16.8 (*)    Hemoglobin 11.7 (*)    MCV 77.8 (*)    MCH 24.7 (*)    RDW 16.0 (*)    Platelets 147 (*)    Neutro Abs 11.0 (*)    Monocytes Relative 17 (*)    Monocytes Absolute 2.9 (*)    All other components within normal limits  COMPREHENSIVE METABOLIC PANEL - Abnormal; Notable for the following:    Glucose, Bld 151 (*)    Albumin 3.4 (*)    Total Bilirubin 0.2 (*)    GFR calc non Af Amer 65 (*)    GFR calc Af Amer 75 (*)    Anion gap 16 (*)    All other components within normal limits  I-STAT CG4 LACTIC ACID, ED - Abnormal; Notable for the following:    Lactic Acid, Venous 2.51 (*)    All other components within normal limits  CULTURE, BLOOD (ROUTINE X 2)  CULTURE, BLOOD (ROUTINE X 2)  TROPONIN I  CBG MONITORING, ED    Imaging Review Dg Chest 2 View  05/22/2014   CLINICAL DATA:  Nonproductive cough. Hypertension. Diabetes. Right upper back pain.  EXAM: CHEST  2 VIEW  COMPARISON:  10/04/2009  FINDINGS: The patient is rotated to the Right on today's radiograph, reducing diagnostic sensitivity and specificity. Indistinct airspace opacity observed in the right lower lobe, suspicious for pneumonia.  Thoracic spondylosis. Cardiac and mediastinal margins appear normal. No pleural effusion identified.   IMPRESSION: 1. Indistinct airspace opacity in the right lower lobe, suspicious for pneumonia. Followup chest radiography is recommended in 4 weeks time to ensure resolution and exclude underlying malignancy.   Electronically Signed   By: Herbie BaltimoreWalt  Liebkemann M.D.   On: 05/22/2014 20:47   Ct Angio Chest Pe W/cm &/or Wo Cm  05/22/2014   CLINICAL DATA:  Chest pain. Chest soreness. Dry cough. Elevated D-dimer. Right back pain.  EXAM: CT ANGIOGRAPHY CHEST WITH CONTRAST  TECHNIQUE: Multidetector CT imaging of the chest was performed using the standard protocol during bolus administration of intravenous contrast. Multiplanar CT image reconstructions and MIPs were obtained to evaluate the vascular anatomy.  CONTRAST:  OMNIPAQUE IOHEXOL 350 MG/ML SOLN  COMPARISON:  05/22/2014; 06/04/2004  FINDINGS: Suboptimally late contrast bolus and suboptimal breathing motion. Pulmonary arterial contrast is adequate if not ideal. I am doubtful the exam would be much better if we repeated it.  Borderline enlargement of the cardiopericardial silhouette no acute aortic findings are identified. Mild intrahepatic biliary dilatation.  There is airspace opacity posteriorly in the right upper lobe. There is likely some volume loss and airspace opacity in the right middle lobe. Indistinct airspace opacity is present in both lower lobes with some degree of volume loss in the lower lobes.  Review of the MIP images confirms the above findings.  IMPRESSION: 1. No embolus identified. Reduced negative predictive value due to motion artifact and adequate but suboptimal contrast bolus. 2. Airspace opacities primarily in the right upper lobe but also with patchy opacities in both lower lobes and the right middle lobe, suspicious for multilobar pneumonia.   Electronically Signed   By: Herbie Baltimore M.D.   On: 05/22/2014 22:14     EKG Interpretation   Date/Time:  Monday May 22 2014 20:42:29 EST Ventricular Rate:  106 PR Interval:   152 QRS Duration: 79 QT Interval:  307 QTC Calculation: 408 R Axis:   58 Text Interpretation:  Sinus tachycardia No significant change since last  tracing Confirmed by Falisa Lamora  MD, Elani Delph ([id number]) on 05/22/2014 8:54:22 PM      MDM   Final diagnoses:  Diabetes mellitus without complication  Bipolar affective disorder, most recent episode unspecified type, remission status unspecified    Patient appears to have multi lobar pneumonia. She has borderline oxygen saturations of 90% on room air. She's also remained tachycardic despite IV fluids. Will give commuting acquired pneumonia antibiotics and admitted to the hospitalist for further treatment. No respiratory distress.    Audree Camel, MD 05/22/14 [phone]

## 2014-05-22 NOTE — ED Notes (Signed)
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

## 2014-05-22 NOTE — ED Notes (Signed)
Patient transported to X-ray 

## 2014-05-22 NOTE — H&P (Signed)
PCP:   No primary care provider on file.   Chief Complaint:  cough  HPI: 53 yo female deaf comes in with one week of coughing and not feeling well.  She has been having some back pain when she coughs and some sob.  No fevers at home.  No le edema or swelling.  No recent abx or hospitalizations.  Feeling better with some ivf.  Mildly hypoxia on RA at 89%.    Review of Systems:  Positive and negative as per HPI otherwise all other systems are negative  Past Medical History: Past Medical History  Diagnosis Date  . Diabetes mellitus without complication   . Bipolar disorder   . Hypertension    History reviewed. No pertinent past surgical history.  Medications: Prior to Admission medications   Medication Sig Start Date End Date Taking? Authorizing Provider  buPROPion (WELLBUTRIN XL) 300 MG 24 hr tablet Take 300 mg by mouth at bedtime.   Yes Historical Provider, MD  Cholecalciferol (VITAMIN D3) 5000 UNITS CAPS Take 1 capsule by mouth daily.   Yes Historical Provider, MD  divalproex (DEPAKOTE) 250 MG DR tablet Take 250 mg by mouth 2 (two) times daily.   Yes Historical Provider, MD  docusate sodium (STOOL SOFTENER) 100 MG capsule Take 100 mg by mouth daily.   Yes Historical Provider, MD  haloperidol (HALDOL) 5 MG tablet Take 5 mg by mouth at bedtime.   Yes Historical Provider, MD  insulin aspart protamine- aspart (NOVOLOG MIX 70/30) (70-30) 100 UNIT/ML injection Inject 25 Units into the skin 3 (three) times daily.   Yes Historical Provider, MD  lisinopril (PRINIVIL,ZESTRIL) 20 MG tablet Take 20 mg by mouth daily.   Yes Historical Provider, MD  rosuvastatin (CRESTOR) 10 MG tablet Take 10 mg by mouth at bedtime.   Yes Historical Provider, MD    Allergies:  No Known Allergies  Social History:  reports that she has never smoked. She does not have any smokeless tobacco history on file. She reports that she drinks alcohol. She reports that she does not use illicit drugs.  Family  History: Family History  Problem Relation Age of Onset  . Hypertension Mother   . Diabetes Mother   . Hypertension Father   . Diabetes Father   . Cancer Sister   . Cancer Brother   . Diabetes Other     Physical Exam: Filed Vitals:   05/22/14 1916 05/22/14 2209  BP: 124/74 126/65  Pulse: 111 109  Temp: 98.3 F (36.8 C)   TempSrc: Oral   Resp: 16 16  SpO2: 99% 95%   General appearance: alert, cooperative and no distress Head: Normocephalic, without obvious abnormality, atraumatic Eyes: negative Nose: Nares normal. Septum midline. Mucosa normal. No drainage or sinus tenderness. Neck: no JVD and supple, symmetrical, trachea midline Lungs: clear to auscultation bilaterally Heart: regular rate and rhythm, S1, S2 normal, no murmur, click, rub or gallop Abdomen: soft, non-tender; bowel sounds normal; no masses,  no organomegaly Extremities: extremities normal, atraumatic, no cyanosis or edema Pulses: 2+ and symmetric Skin: Skin color, texture, turgor normal. No rashes or lesions Neurologic: Grossly normal   Labs on Admission:   Recent Labs  05/22/14 2023  NA 137  K 4.2  CL 97  CO2 24  GLUCOSE 151*  BUN 13  CREATININE 0.97  CALCIUM 9.3    Recent Labs  05/22/14 2023  AST 9  ALT <5  ALKPHOS 51  BILITOT 0.2*  PROT 7.7  ALBUMIN 3.4*  Recent Labs  05/22/14 2023  WBC 16.8*  NEUTROABS 11.0*  HGB 11.7*  HCT 36.8  MCV 77.8*  PLT 147*    Recent Labs  05/22/14 2023  TROPONINI <0.30   Radiological Exams on Admission: Dg Chest 2 View  05/22/2014   CLINICAL DATA:  Nonproductive cough. Hypertension. Diabetes. Right upper back pain.  EXAM: CHEST  2 VIEW  COMPARISON:  10/04/2009  FINDINGS: The patient is rotated to the Right on today's radiograph, reducing diagnostic sensitivity and specificity. Indistinct airspace opacity observed in the right lower lobe, suspicious for pneumonia.  Thoracic spondylosis. Cardiac and mediastinal margins appear normal. No  pleural effusion identified.  IMPRESSION: 1. Indistinct airspace opacity in the right lower lobe, suspicious for pneumonia. Followup chest radiography is recommended in 4 weeks time to ensure resolution and exclude underlying malignancy.   Electronically Signed   By: Herbie BaltimoreWalt  Liebkemann M.D.   On: 05/22/2014 20:47   Ct Angio Chest Pe W/cm &/or Wo Cm  05/22/2014   CLINICAL DATA:  Chest pain. Chest soreness. Dry cough. Elevated D-dimer. Right back pain.  EXAM: CT ANGIOGRAPHY CHEST WITH CONTRAST  TECHNIQUE: Multidetector CT imaging of the chest was performed using the standard protocol during bolus administration of intravenous contrast. Multiplanar CT image reconstructions and MIPs were obtained to evaluate the vascular anatomy.  CONTRAST:  100mL OMNIPAQUE IOHEXOL 350 MG/ML SOLN  COMPARISON:  05/22/2014; 06/04/2004  FINDINGS: Suboptimally late contrast bolus and suboptimal breathing motion. Pulmonary arterial contrast is adequate if not ideal. I am doubtful the exam would be much better if we repeated it.  Borderline enlargement of the cardiopericardial silhouette no acute aortic findings are identified. Mild intrahepatic biliary dilatation.  There is airspace opacity posteriorly in the right upper lobe. There is likely some volume loss and airspace opacity in the right middle lobe. Indistinct airspace opacity is present in both lower lobes with some degree of volume loss in the lower lobes.  Review of the MIP images confirms the above findings.  IMPRESSION: 1. No embolus identified. Reduced negative predictive value due to motion artifact and adequate but suboptimal contrast bolus. 2. Airspace opacities primarily in the right upper lobe but also with patchy opacities in both lower lobes and the right middle lobe, suspicious for multilobar pneumonia.   Electronically Signed   By: Herbie BaltimoreWalt  Liebkemann M.D.   On: 05/22/2014 22:14    Assessment/Plan  53 yo female with CAP and mild hypoxia  Principal Problem:   CAP  (community acquired pneumonia)-  pna pathway.  Iv rocphin/azithro.  Ct neg for PE but shows multilobular pna.  Active Problems:  Stable unless o/w noted   Diabetes mellitus without complication   Bipolar disorder  Cont home meds   Hypertension  Cont home meds   Hypoxia   Deaf  Can read lips.  Can communicate thru writing.  Admit to tele.  Full code.  Tarell Schollmeyer A 05/22/2014, 11:34 PM

## 2014-05-23 LAB — CBC WITH DIFFERENTIAL/PLATELET
Basophils Absolute: 0 10*3/uL (ref 0.0–0.1)
Basophils Relative: 0 % (ref 0–1)
EOS ABS: 0 10*3/uL (ref 0.0–0.7)
Eosinophils Relative: 0 % (ref 0–5)
HCT: 35.2 % — ABNORMAL LOW (ref 36.0–46.0)
Hemoglobin: 11 g/dL — ABNORMAL LOW (ref 12.0–15.0)
LYMPHS PCT: 16 % (ref 12–46)
Lymphs Abs: 2 10*3/uL (ref 0.7–4.0)
MCH: 24.5 pg — ABNORMAL LOW (ref 26.0–34.0)
MCHC: 31.3 g/dL (ref 30.0–36.0)
MCV: 78.4 fL (ref 78.0–100.0)
Monocytes Absolute: 2 10*3/uL — ABNORMAL HIGH (ref 0.1–1.0)
Monocytes Relative: 16 % — ABNORMAL HIGH (ref 3–12)
NEUTROS ABS: 8.3 10*3/uL — AB (ref 1.7–7.7)
NEUTROS PCT: 68 % (ref 43–77)
PLATELETS: 97 10*3/uL — AB (ref 150–400)
RBC: 4.49 MIL/uL (ref 3.87–5.11)
RDW: 16.3 % — AB (ref 11.5–15.5)
WBC: 12.3 10*3/uL — AB (ref 4.0–10.5)

## 2014-05-23 LAB — GLUCOSE, CAPILLARY
GLUCOSE-CAPILLARY: 161 mg/dL — AB (ref 70–99)
Glucose-Capillary: 132 mg/dL — ABNORMAL HIGH (ref 70–99)
Glucose-Capillary: 62 mg/dL — ABNORMAL LOW (ref 70–99)
Glucose-Capillary: 82 mg/dL (ref 70–99)
Glucose-Capillary: 111 mg/dL — ABNORMAL HIGH (ref 70–99)

## 2014-05-23 LAB — BASIC METABOLIC PANEL
ANION GAP: 14 (ref 5–15)
BUN: 11 mg/dL (ref 6–23)
CALCIUM: 8.5 mg/dL (ref 8.4–10.5)
CO2: 23 mEq/L (ref 19–32)
CREATININE: 0.88 mg/dL (ref 0.50–1.10)
Chloride: 100 mEq/L (ref 96–112)
GFR, EST AFRICAN AMERICAN: 84 mL/min — AB (ref >90)
GFR, EST NON AFRICAN AMERICAN: 73 mL/min — AB (ref >90)
Glucose, Bld: 152 mg/dL — ABNORMAL HIGH (ref 70–99)
Potassium: 4.4 mEq/L (ref 3.7–5.3)
SODIUM: 137 meq/L (ref 137–147)

## 2014-05-23 LAB — STREP PNEUMONIAE URINARY ANTIGEN: STREP PNEUMO URINARY ANTIGEN: NEGATIVE

## 2014-05-23 LAB — CBG MONITORING, ED: Glucose-Capillary: 129 mg/dL — ABNORMAL HIGH (ref 70–99)

## 2014-05-23 MED ORDER — PNEUMOCOCCAL VAC POLYVALENT 25 MCG/0.5ML IJ INJ
0.5000 mL | INJECTION | INTRAMUSCULAR | Status: AC
  Administered 2014-05-24: 0.5 mL via INTRAMUSCULAR
  Filled 2014-05-23 (×2): qty 0.5

## 2014-05-23 MED ORDER — DEXTROSE 5 % IV SOLN
500.0000 mg | INTRAVENOUS | Status: DC
  Administered 2014-05-23: 500 mg via INTRAVENOUS
  Filled 2014-05-23: qty 500

## 2014-05-23 MED ORDER — HALOPERIDOL 5 MG PO TABS
5.0000 mg | ORAL_TABLET | Freq: Every day | ORAL | Status: DC
  Administered 2014-05-23 – 2014-05-25 (×3): 5 mg via ORAL
  Filled 2014-05-23 (×3): qty 1

## 2014-05-23 MED ORDER — ALBUTEROL SULFATE (2.5 MG/3ML) 0.083% IN NEBU: 2.5000 mg | INHALATION_SOLUTION | RESPIRATORY_TRACT | Status: DC | PRN

## 2014-05-23 MED ORDER — INSULIN ASPART PROT & ASPART (70-30 MIX) 100 UNIT/ML ~~LOC~~ SUSP
25.0000 [IU] | Freq: Three times a day (TID) | SUBCUTANEOUS | Status: DC
  Administered 2014-05-23 (×2): 25 [IU] via SUBCUTANEOUS
  Filled 2014-05-23: qty 10

## 2014-05-23 MED ORDER — ROSUVASTATIN CALCIUM 10 MG PO TABS
10.0000 mg | ORAL_TABLET | Freq: Every day | ORAL | Status: DC
  Administered 2014-05-23 – 2014-05-25 (×3): 10 mg via ORAL
  Filled 2014-05-23 (×3): qty 1

## 2014-05-23 MED ORDER — ALBUTEROL SULFATE (2.5 MG/3ML) 0.083% IN NEBU
2.5000 mg | INHALATION_SOLUTION | Freq: Four times a day (QID) | RESPIRATORY_TRACT | Status: DC
  Administered 2014-05-23 (×2): 2.5 mg via RESPIRATORY_TRACT
  Filled 2014-05-23 (×2): qty 3

## 2014-05-23 MED ORDER — CEFTRIAXONE SODIUM IN DEXTROSE 20 MG/ML IV SOLN
1.0000 g | INTRAVENOUS | Status: DC
  Administered 2014-05-23: 1 g via INTRAVENOUS
  Filled 2014-05-23: qty 50

## 2014-05-23 MED ORDER — BUPROPION HCL ER (XL) 300 MG PO TB24
300.0000 mg | ORAL_TABLET | Freq: Every day | ORAL | Status: DC
  Administered 2014-05-23 – 2014-05-25 (×3): 300 mg via ORAL
  Filled 2014-05-23 (×3): qty 1

## 2014-05-23 MED ORDER — ENOXAPARIN SODIUM 40 MG/0.4ML ~~LOC~~ SOLN
40.0000 mg | SUBCUTANEOUS | Status: DC
  Administered 2014-05-23: 40 mg via SUBCUTANEOUS
  Filled 2014-05-23: qty 0.4

## 2014-05-23 MED ORDER — INSULIN ASPART 100 UNIT/ML ~~LOC~~ SOLN
0.0000 [IU] | Freq: Three times a day (TID) | SUBCUTANEOUS | Status: DC
  Administered 2014-05-24: 2 [IU] via SUBCUTANEOUS
  Administered 2014-05-25: 3 [IU] via SUBCUTANEOUS
  Administered 2014-05-25: 2 [IU] via SUBCUTANEOUS
  Administered 2014-05-25: 1 [IU] via SUBCUTANEOUS
  Administered 2014-05-26: 2 [IU] via SUBCUTANEOUS

## 2014-05-23 MED ORDER — SODIUM CHLORIDE 0.9 % IV SOLN
INTRAVENOUS | Status: DC
  Administered 2014-05-23: 04:00:00 via INTRAVENOUS

## 2014-05-23 MED ORDER — DIVALPROEX SODIUM 250 MG PO DR TAB
250.0000 mg | DELAYED_RELEASE_TABLET | Freq: Two times a day (BID) | ORAL | Status: DC
  Administered 2014-05-23 – 2014-05-26 (×7): 250 mg via ORAL
  Filled 2014-05-23 (×8): qty 1

## 2014-05-23 MED ORDER — DOCUSATE SODIUM 100 MG PO CAPS
100.0000 mg | ORAL_CAPSULE | Freq: Every day | ORAL | Status: DC
  Administered 2014-05-23 – 2014-05-26 (×4): 100 mg via ORAL
  Filled 2014-05-23 (×4): qty 1

## 2014-05-23 MED ORDER — LISINOPRIL 20 MG PO TABS
20.0000 mg | ORAL_TABLET | Freq: Every day | ORAL | Status: DC
  Administered 2014-05-23 – 2014-05-26 (×4): 20 mg via ORAL
  Filled 2014-05-23 (×4): qty 1

## 2014-05-23 NOTE — Care Management Note (Addendum)
    Page 1 of 1   05/26/2014     4:13:03 PM CARE MANAGEMENT NOTE 05/26/2014  Patient:  Stacie Adams,[name]   Account Number:  [id number]  Date Initiated:  05/23/2014  Documentation initiated by:  Lanier ClamMAHABIR,Laysa Kimmey  Subjective/Objective Assessment:   53 Y/O f admitted w/pna.     Action/Plan:   From home.   Anticipated DC Date:  05/26/2014   Anticipated DC Plan:  HOME/SELF CARE      DC Planning Services  CM consult      Choice offered to / List presented to:             Status of service:  Completed, signed off Medicare Important Message given?  YES (If response is "NO", the following Medicare IM given date fields will be blank) Date Medicare IM given:  05/26/2014 Medicare IM given by:  [facility],Gunther Zawadzki Date Additional Medicare IM given:   Additional Medicare IM given by:    Discharge Disposition:  HOME/SELF CARE  Per UR Regulation:  Reviewed for med. necessity/level of care/duration of stay  If discussed at Long Length of Stay Meetings, dates discussed:    Comments:  05/26/14 Lanier ClamKathy Sione Baumgarten RN BSN NCM [phone] no d/c needs or orders.  05/23/14 Lanier ClamKathy Azlin Zilberman RN BSN NCM [phone] No anticipated d/c needs.

## 2014-05-23 NOTE — Progress Notes (Addendum)
Patient ID: Stacie Adams, female   DOB: [birth year], 53 y.o.   MRN: [id number]  [facility] PROGRESS NOTE  Stacie Adams [id number] DOB: [birth year] DOA: 05/22/2014 PCP: No primary care provider on file.  Brief narrative: Patient is 53 year old female who is deaf, presented to [facility] with one week duration of progressively worsening dyspnea mostly with exertion, now present at rest, associated with subjective fevers and chills, cough productive of yellow sputum.  Assessment and Plan:    Principal Problem:   Acute respiratory failure secondary to community-acquired pneumonia - no specific pathogen identified at this point - Respiratory status is stable this morning, patient maintaining oxygen saturations at target range - continue Zithromax and Rocephin day 2, follow-up and urine cultures sputum analysis, urine Legionella and strep pneumo - Provide bronchodilators scheduled and as needed Active Problems:   Diabetes mellitus without complication - Reasonable inpatient control   Bipolar disorder - Stable this morning    Hypertension - Reasonable inpatient control    Deaf - Interpreter at bedside    Thrombocytopenia  - Drop in platelets since admission - Lovenox for DVT prophylaxis and place on SCDs    Leukocytosis   - Secondary to pneumonia, antibiotics as noted above and repeat CBC in the morning      DVT prophylaxis  SCD  Code Status: Full Family Communication: Pt at bedside Disposition Plan: Home when medically stable   IV Access:   Peripheral IV Procedures and diagnostic studies:    Dg Chest 2 View  05/22/2014   Indistinct airspace opacity in the right lower lobe, suspicious for pneumonia.   Ct Angio Chest Pe W/cm &/or Wo Cm  05/22/2014  No embolus identified. Reduced negative predictive value due to motion artifact and adequate but suboptimal contrast bolus. 2. Airspace opacities primarily in the right upper lobe but also with patchy opacities in  both lower lobes and the right middle lobe, suspicious for multilobar pneumonia.    Medical Consultants:   None Other Consultants:   None Anti-Infectives:   Zithromax 12/14 --> Rocephin 12.14 -->  Debbora PrestoMAGICK-Sayeed Weatherall, MD  [facility] Pager [phone]  If 7PM-7AM, please contact night-coverage [email or web address] Password [email or web address] 05/23/2014, 1:05 PM   LOS: 1 day   HPI/Subjective: No events overnight.   Objective: Filed Vitals:   05/23/14 0230 05/23/14 0245 05/23/14 0321 05/23/14 0705  BP: 102/56 101/58 128/79 119/65  Pulse: 93 90 106 97  Temp:   98.5 F (36.9 C) 98.5 F (36.9 C)  TempSrc:   Oral Oral  Resp: 19 19 20 20   Height:   5\' 2"  (1.575 m)   Weight:   111.6 kg (246 lb 0.5 oz)   SpO2: 96% 95% 94% 95%    Intake/Output Summary (Last 24 hours) at 05/23/14 1305 Last data filed at 05/23/14 [id number]  Gross per 24 hour  Intake    240 ml  Output      0 ml  Net    240 ml    Exam:   General:  Pt is alert, follows commands appropriately, not in acute distress  Cardiovascular: Regular rate and rhythm, S1/S2, no murmurs, no rubs, no gallops  Respiratory: Clear to auscultation bilaterally, no wheezing, rhonchi at bases   Abdomen: Soft, non tender, non distended, bowel sounds present, no guarding  Extremities: No edema, pulses DP and PT palpable bilaterally  Neuro: Grossly nonfocal  Data Reviewed: Basic Metabolic Panel:  Recent Labs Lab 05/22/14 2023 05/23/14 0452  NA 137 137  K 4.2 4.4  CL 97 100  CO2 24 23  GLUCOSE 151* 152*  BUN 13 11  CREATININE 0.97 0.88  CALCIUM 9.3 8.5   Liver Function Tests:  Recent Labs Lab 05/22/14 2023  AST 9  ALT <5  ALKPHOS 51  BILITOT 0.2*  PROT 7.7  ALBUMIN 3.4*   CBC:  Recent Labs Lab 05/22/14 2023 05/23/14 0452  WBC 16.8* 12.3*  NEUTROABS 11.0* 8.3*  HGB 11.7* 11.0*  HCT 36.8 35.2*  MCV 77.8* 78.4  PLT 147* 97*   Cardiac Enzymes:  Recent Labs Lab 05/22/14 2023  TROPONINI <0.30   CBG:  Recent Labs Lab  05/22/14 1928 05/23/14 0831 05/23/14 1204  GLUCAP 129* 132* 161*    Scheduled Meds: . azithromycin  500 mg Intravenous Q24H  . buPROPion  300 mg Oral QHS  . cefTRIAXone (ROCEPHIN)  IV  1 g Intravenous Q24H  . divalproex  250 mg Oral BID  . docusate sodium  100 mg Oral Daily  . enoxaparin (LOVENOX) injection  40 mg Subcutaneous Q24H  . haloperidol  5 mg Oral QHS  . insulin aspart protamine- aspart  25 Units Subcutaneous TID WC  . lisinopril  20 mg Oral Daily  . rosuvastatin  10 mg Oral QHS   Continuous Infusions: . sodium chloride 75 mL/hr at 05/23/14 [phone]

## 2014-05-23 NOTE — Progress Notes (Signed)
CRITICAL VALUE ALERT  Critical value received:  Positive blood culture.aerobic bottle(1) showed gram + cocci in clusters  Date of notification:  05-23-2014  Time of notification: 2245  Critical value read back:yes  Nurse who received alert: Karie SchwalbeJoanne Latrail Pounders  MD notified (1st page):  yes  Time of first page:  2254

## 2014-05-23 NOTE — Progress Notes (Signed)
Not giving 25 units of 70/30 insulin that is due now because patient had a CBG-62. After drinking orange juice it is now 82. I paged Dr. Izola PriceMyers to let her know that I was not giving insulin.

## 2014-05-23 NOTE — Progress Notes (Signed)
  CARE MANAGEMENT ED NOTE 05/23/2014  Patient:  Stacie Adams,Stacie Adams   Account Number:  [id number]  Date Initiated:  05/22/2014  Documentation initiated by:  Radford PaxFERRERO,Mayia Megill  Subjective/Objective Assessment:   Patient presents to Ed with right upper back pian, non productive cough with chest pain when coughing     Subjective/Objective Assessment Detail:   Patient is hearing impaired.  Patient with pmhx of DM, bipolar and HTN.  CTA of chest :1. No embolus identified. Reduced negative predictive value due to  motion artifact and adequate but suboptimal contrast bolus.  2. Airspace opacities primarily in the right upper lobe but also  with patchy opacities in both lower lobes and the right middle lobe,  suspicious for multilobar pneumonia.     Action/Plan:   Action/Plan Detail:   Anticipated DC Date:       Status Recommendation to Physician:   Result of Recommendation:    Other ED Services  Consult Working Plan    DC Planning Services  Other  PCP issues    Choice offered to / List presented to:            Status of service:  Completed, signed off  ED Comments:   ED Comments Detail:  EDCM spoke to patient's daughter at bedside.  Patient's daughter confirms patient's pcp is Dr. Venida Jarvisnsei-Bonsu.  System updated. Patient's daughter reports the patient sees her pcp every three months and her next appointment is in January. Patient's daughter reports she is in the process of finding a new pcp for the patient at [facility] as she was told they have easier access for deaf interpreters.  No furhter EDCM needs at this time.

## 2014-05-24 DIAGNOSIS — J189 Pneumonia, unspecified organism: Secondary | ICD-10-CM | POA: Diagnosis not present

## 2014-05-24 LAB — CBC
HCT: 29.9 % — ABNORMAL LOW (ref 36.0–46.0)
Hemoglobin: 9.9 g/dL — ABNORMAL LOW (ref 12.0–15.0)
MCH: 25.6 pg — AB (ref 26.0–34.0)
MCHC: 33.1 g/dL (ref 30.0–36.0)
MCV: 77.5 fL — ABNORMAL LOW (ref 78.0–100.0)
PLATELETS: 135 10*3/uL — AB (ref 150–400)
RBC: 3.86 MIL/uL — AB (ref 3.87–5.11)
RDW: 16 % — ABNORMAL HIGH (ref 11.5–15.5)
WBC: 10.6 10*3/uL — ABNORMAL HIGH (ref 4.0–10.5)

## 2014-05-24 LAB — BASIC METABOLIC PANEL
ANION GAP: 12 (ref 5–15)
BUN: 11 mg/dL (ref 6–23)
CALCIUM: 8.7 mg/dL (ref 8.4–10.5)
CO2: 25 meq/L (ref 19–32)
Chloride: 106 mEq/L (ref 96–112)
Creatinine, Ser: 0.88 mg/dL (ref 0.50–1.10)
GFR calc Af Amer: 84 mL/min — ABNORMAL LOW (ref >90)
GFR, EST NON AFRICAN AMERICAN: 73 mL/min — AB (ref >90)
Glucose, Bld: 83 mg/dL (ref 70–99)
Potassium: 4 mEq/L (ref 3.7–5.3)
Sodium: 143 mEq/L (ref 137–147)

## 2014-05-24 LAB — GLUCOSE, CAPILLARY
GLUCOSE-CAPILLARY: 92 mg/dL (ref 70–99)
GLUCOSE-CAPILLARY: 159 mg/dL — AB (ref 70–99)
Glucose-Capillary: 190 mg/dL — ABNORMAL HIGH (ref 70–99)
Glucose-Capillary: 112 mg/dL — ABNORMAL HIGH (ref 70–99)

## 2014-05-24 LAB — LEGIONELLA ANTIGEN, URINE

## 2014-05-24 MED ORDER — ALBUTEROL SULFATE (2.5 MG/3ML) 0.083% IN NEBU
2.5000 mg | INHALATION_SOLUTION | Freq: Two times a day (BID) | RESPIRATORY_TRACT | Status: DC
  Administered 2014-05-24 (×2): 2.5 mg via RESPIRATORY_TRACT
  Filled 2014-05-24 (×2): qty 3

## 2014-05-24 MED ORDER — PIPERACILLIN-TAZOBACTAM 3.375 G IVPB
3.3750 g | Freq: Three times a day (TID) | INTRAVENOUS | Status: DC
  Administered 2014-05-24 (×2): 3.375 g via INTRAVENOUS
  Filled 2014-05-24 (×3): qty 50

## 2014-05-24 MED ORDER — VANCOMYCIN HCL IN DEXTROSE 1-5 GM/200ML-% IV SOLN
1000.0000 mg | Freq: Two times a day (BID) | INTRAVENOUS | Status: DC
  Filled 2014-05-24: qty 200

## 2014-05-24 MED ORDER — VANCOMYCIN HCL 10 G IV SOLR
1500.0000 mg | Freq: Once | INTRAVENOUS | Status: AC
  Administered 2014-05-24: 1500 mg via INTRAVENOUS
  Filled 2014-05-24: qty 1500

## 2014-05-24 MED ORDER — CEFTRIAXONE SODIUM IN DEXTROSE 20 MG/ML IV SOLN
1.0000 g | INTRAVENOUS | Status: DC
  Administered 2014-05-24 – 2014-05-25 (×2): 1 g via INTRAVENOUS
  Filled 2014-05-24 (×3): qty 50

## 2014-05-24 MED ORDER — GUAIFENESIN 100 MG/5ML PO SOLN
200.0000 mg | ORAL | Status: DC | PRN
  Administered 2014-05-24: 200 mg via ORAL
  Filled 2014-05-24: qty 10

## 2014-05-24 MED ORDER — DEXTROSE 5 % IV SOLN
500.0000 mg | INTRAVENOUS | Status: DC
  Administered 2014-05-24 – 2014-05-25 (×2): 500 mg via INTRAVENOUS
  Filled 2014-05-24 (×3): qty 500

## 2014-05-24 NOTE — Progress Notes (Signed)
ANTIBIOTIC CONSULT NOTE - INITIAL  Pharmacy Consult for Vancomycin and Zosyn  Indication: pneumonia  No Known Allergies  Patient Measurements: Height: 5\' 2"  (157.5 cm) Weight: 246 lb 0.5 oz (111.6 kg) IBW/kg (Calculated) : 50.1 Adjusted Body Weight:   Vital Signs: Temp: 98.1 F (36.7 C) (12/15 2159) Temp Source: Oral (12/15 2159) BP: 128/66 mmHg (12/15 2159) Pulse Rate: 88 (12/15 2159) Intake/Output from previous day: 12/15 0701 - 12/16 0700 In: 720 [P.O.:720] Out: -  Intake/Output from this shift:    Labs:  Recent Labs  05/22/14 2023 05/23/14 0452  WBC 16.8* 12.3*  HGB 11.7* 11.0*  PLT 147* 97*  CREATININE 0.97 0.88   Estimated Creatinine Clearance: 85.2 mL/min (by C-G formula based on Cr of 0.88). No results for input(s): VANCOTROUGH, VANCOPEAK, VANCORANDOM, GENTTROUGH, GENTPEAK, GENTRANDOM, TOBRATROUGH, TOBRAPEAK, TOBRARND, AMIKACINPEAK, AMIKACINTROU, AMIKACIN in the last 72 hours.   Microbiology: Recent Results (from the past 720 hour(s))  Blood culture (routine x 2)     Status: None (Preliminary result)   Collection Time: 05/22/14 10:46 PM  Result Value Ref Range Status   Specimen Description BLOOD RIGHT HAND  Final   Special Requests BOTTLES DRAWN AEROBIC AND ANAEROBIC 5CC  Final   Culture  Setup Time   Final    05/23/2014 04:43 Performed at [facility]    Culture   Final    GRAM POSITIVE COCCI IN CLUSTERS Performed at [facility]    Report Status PENDING  Incomplete    Medical History: Past Medical History  Diagnosis Date  . Diabetes mellitus without complication   . Bipolar disorder   . Hypertension     Medications:  Anti-infectives    Start     Dose/Rate Route Frequency Ordered Stop   05/24/14 1200  vancomycin (VANCOCIN) IVPB 1000 mg/200 mL premix     1,000 mg200 mL/hr over 60 Minutes Intravenous Every 12 hours 05/24/14 0018     05/24/14 0030  vancomycin (VANCOCIN) 1,500 mg in sodium chloride 0.9 % 500 mL IVPB     1,500 mg250 mL/hr over 120 Minutes Intravenous  Once 05/24/14 0018     05/24/14 0030  piperacillin-tazobactam (ZOSYN) IVPB 3.375 g     3.375 g12.5 mL/hr over 240 Minutes Intravenous 3 times per day 05/24/14 0018     05/23/14 2200  cefTRIAXone (ROCEPHIN) 1 g in dextrose 5 % 50 mL IVPB - Premix  Status:  Discontinued     1 g100 mL/hr over 30 Minutes Intravenous Every 24 hours 05/23/14 0312 05/24/14 0010   05/23/14 2200  azithromycin (ZITHROMAX) 500 mg in dextrose 5 % 250 mL IVPB  Status:  Discontinued     500 mg250 mL/hr over 60 Minutes Intravenous Every 24 hours 05/23/14 0312 05/24/14 0010   05/22/14 2245  cefTRIAXone (ROCEPHIN) 1 g in dextrose 5 % 50 mL IVPB     1 g100 mL/hr over 30 Minutes Intravenous  Once 05/22/14 2232 05/23/14 0048   05/22/14 2245  azithromycin (ZITHROMAX) 500 mg in dextrose 5 % 250 mL IVPB     500 mg250 mL/hr over 60 Minutes Intravenous  Once 05/22/14 2232 05/23/14 0220     Assessment: Patient with CAP and now positive blood cultures.  MD changed to Vancomycin and Zosyn.    Goal of Therapy:  Vancomycin trough level 15-20 mcg/ml  Zosyn based on renal function   Plan:  Measure antibiotic drug levels at steady state Follow up culture results Vancomycin 1500mg  iv x1, then 1gm iv q12hr  Zosyn 3.375g IV Q8H infused over 4hrs.   Stacie Adams, Stacie Adams 05/24/2014,12:20 AM

## 2014-05-24 NOTE — Clinical Documentation Improvement (Deleted)
  Patient admitted with CAP.  WBC on admission - 16.8;  Lactic Acid - 2.51;  Heart Rate in the 110's.    Possible Clinical Conditions:   - Sepsis 2/2 CAP, Present on Admission   - Sepsis is not applicable to this admission   - Other Condition   - Unable to Clinically Determine  Thank You, Jerral Ralphathy R Elenie Coven ,RN Clinical Documentation Specialist:  [phone] [facility]- Health Information Management

## 2014-05-24 NOTE — Progress Notes (Addendum)
Patient ID: Stacie Adams, female   DOB: [birth year], 53 y.o.   MRN: [id number]  [facility] PROGRESS NOTE  Stacie HectorRalphine K Rothrock NUU:[id number] DOB: [birth year] DOA: 05/22/2014 PCP: No primary care provider on file.   Brief narrative: Patient is 53 year old female who is deaf, presented to [facility] with one week duration of progressively worsening dyspnea mostly with exertion, now present at rest, associated with subjective fevers and chills, cough productive of yellow sputum.  Assessment and Plan:    Principal Problem:  Acute respiratory failure secondary to community-acquired pneumonia - no specific pathogen identified at this point - Respiratory status is stable this morning, patient maintaining oxygen saturations at target range - continue Zithromax and Rocephin day 3, follow-up and urine cultures sputum analysis, urine Legionella and strep pneumo - Provide bronchodilators scheduled and as needed - pt is clinically improving, afebrile, WBC trending down  Active Problems:   G+ cocci in clusters 1/2 blood cultures - from 12/14, likely contaminant, pt was changed to Vanc and Zosyn overnight 12/15, will d/c both - I d/w ID specialist Dr. Luciana Axeomer and he confirmed this is most likely contaminant, no need for repeat blood cultures - agreed with placing back on Zithromax and Rocephin   Diabetes mellitus without complication - Reasonable inpatient control  Bipolar disorder - Stable this morning   Hypertension - Reasonable inpatient control   Deaf - Interpreter at bedside   Thrombocytopenia  - Drop in platelets since admission - Lovenox for DVT prophylaxis and place on SCDs  Leukocytosis  - Secondary to pneumonia, antibiotics as noted above and repeat CBC in the morning    DVT prophylaxis  SCD  Code Status: Full Family Communication: Pt at bedside Disposition Plan: Home when medically stable   IV Access:    Peripheral IV Procedures and diagnostic  studies:    Dg Chest 2 View 05/22/2014 Indistinct airspace opacity in the right lower lobe, suspicious for pneumonia.   Ct Angio Chest Pe W/cm &/or Wo Cm 05/22/2014 No embolus identified. Reduced negative predictive value due to motion artifact and adequate but suboptimal contrast bolus. 2. Airspace opacities primarily in the right upper lobe but also with patchy opacities in both lower lobes and the right middle lobe, suspicious for multilobar pneumonia.  Medical Consultants:    None Other Consultants:    None Anti-Infectives:    Zithromax 12/14 -->  Rocephin 12.14 -->   Debbora PrestoMAGICK-Pleas Carneal, MD  [facility] Pager [phone]  If 7PM-7AM, please contact night-coverage [email or web address] Password [email or web address] 05/24/2014, 7:24 AM   LOS: 2 days   HPI/Subjective: No events overnight.   Objective: Filed Vitals:   05/23/14 1411 05/23/14 1922 05/23/14 2159 05/24/14 0433  BP:   128/66 103/55  Pulse:   88 81  Temp:   98.1 F (36.7 C) 98.2 F (36.8 C)  TempSrc:   Oral Oral  Resp:   20 16  Height:      Weight:      SpO2: 92% 94% 95% 96%    Intake/Output Summary (Last 24 hours) at 05/24/14 0724 Last data filed at 05/24/14 0504  Gross per 24 hour  Intake    820 ml  Output      0 ml  Net    820 ml    Exam:   General:  Pt is alert, follows commands appropriately, not in acute distress  Cardiovascular: Regular rate and rhythm, S1/S2, no murmurs, no rubs, no gallops  Respiratory: Clear to auscultation bilaterally, no wheezing, no crackles, no rhonchi  Abdomen: Soft, non tender, non distended, bowel sounds present, no guarding  Extremities: No edema, pulses DP and PT palpable bilaterally  Neuro: Grossly nonfocal  Data Reviewed: Basic Metabolic Panel:  Recent Labs Lab 05/22/14 2023 05/23/14 0452 05/24/14 0453  NA 137 137 143  K 4.2 4.4 4.0  CL 97 100 106  CO2 24 23 25   GLUCOSE 151* 152* 83  BUN 13 11 11   CREATININE 0.97 0.88 0.88  CALCIUM 9.3 8.5 8.7   Liver  Function Tests:  Recent Labs Lab 05/22/14 2023  AST 9  ALT <5  ALKPHOS 51  BILITOT 0.2*  PROT 7.7  ALBUMIN 3.4*   No results for input(s): LIPASE, AMYLASE in the last 168 hours. No results for input(s): AMMONIA in the last 168 hours. CBC:  Recent Labs Lab 05/22/14 2023 05/23/14 0452 05/24/14 0453  WBC 16.8* 12.3* 10.6*  NEUTROABS 11.0* 8.3*  --   HGB 11.7* 11.0* 9.9*  HCT 36.8 35.2* 29.9*  MCV 77.8* 78.4 77.5*  PLT 147* 97* 135*   Cardiac Enzymes:  Recent Labs Lab 05/22/14 2023  TROPONINI <0.30   BNP: Invalid input(s): POCBNP CBG:  Recent Labs Lab 05/23/14 0831 05/23/14 1204 05/23/14 1639 05/23/14 1711 05/23/14 2203  GLUCAP 132* 161* 62* 82 111*    Recent Results (from the past 240 hour(s))  Blood culture (routine x 2)     Status: None (Preliminary result)   Collection Time: 05/22/14 10:46 PM  Result Value Ref Range Status   Specimen Description BLOOD RIGHT HAND  Final   Special Requests BOTTLES DRAWN AEROBIC AND ANAEROBIC 5CC  Final   Culture  Setup Time   Final    05/23/2014 04:43 Performed at [facility]    Culture   Final    GRAM POSITIVE COCCI IN CLUSTERS Performed at [facility]    Report Status PENDING  Incomplete     Scheduled Meds: . albuterol  2.5 mg Nebulization BID  . buPROPion  300 mg Oral QHS  . divalproex  250 mg Oral BID  . docusate sodium  100 mg Oral Daily  . haloperidol  5 mg Oral QHS  . insulin aspart  0-9 Units Subcutaneous TID WC  . insulin aspart protamine- aspart  25 Units Subcutaneous TID WC  . lisinopril  20 mg Oral Daily  . piperacillin-tazobactam (ZOSYN)  IV  3.375 g Intravenous 3 times per day  . pneumococcal 23 valent vaccine  0.5 mL Intramuscular Tomorrow-1000  . rosuvastatin  10 mg Oral QHS  . vancomycin  1,000 mg Intravenous Q12H   Continuous Infusions:

## 2014-05-25 LAB — CBC
HCT: 32.9 % — ABNORMAL LOW (ref 36.0–46.0)
HEMOGLOBIN: 10.3 g/dL — AB (ref 12.0–15.0)
MCH: 24.2 pg — AB (ref 26.0–34.0)
MCHC: 31.3 g/dL (ref 30.0–36.0)
MCV: 77.2 fL — AB (ref 78.0–100.0)
PLATELETS: 191 10*3/uL (ref 150–400)
RBC: 4.26 MIL/uL (ref 3.87–5.11)
RDW: 15.6 % — ABNORMAL HIGH (ref 11.5–15.5)
WBC: 10.9 10*3/uL — AB (ref 4.0–10.5)

## 2014-05-25 LAB — GLUCOSE, CAPILLARY
GLUCOSE-CAPILLARY: 216 mg/dL — AB (ref 70–99)
Glucose-Capillary: 137 mg/dL — ABNORMAL HIGH (ref 70–99)
Glucose-Capillary: 169 mg/dL — ABNORMAL HIGH (ref 70–99)
Glucose-Capillary: 167 mg/dL — ABNORMAL HIGH (ref 70–99)

## 2014-05-25 LAB — BASIC METABOLIC PANEL
ANION GAP: 13 (ref 5–15)
BUN: 10 mg/dL (ref 6–23)
CO2: 25 meq/L (ref 19–32)
CREATININE: 0.87 mg/dL (ref 0.50–1.10)
Calcium: 9.1 mg/dL (ref 8.4–10.5)
Chloride: 101 mEq/L (ref 96–112)
GFR calc Af Amer: 85 mL/min — ABNORMAL LOW (ref >90)
GFR calc non Af Amer: 74 mL/min — ABNORMAL LOW (ref >90)
Glucose, Bld: 127 mg/dL — ABNORMAL HIGH (ref 70–99)
Potassium: 4 mEq/L (ref 3.7–5.3)
Sodium: 139 mEq/L (ref 137–147)

## 2014-05-25 LAB — CULTURE, BLOOD (ROUTINE X 2)

## 2014-05-25 MED ORDER — ALBUTEROL SULFATE (2.5 MG/3ML) 0.083% IN NEBU: 2.5000 mg | INHALATION_SOLUTION | Freq: Four times a day (QID) | RESPIRATORY_TRACT | Status: DC | PRN

## 2014-05-25 NOTE — Progress Notes (Signed)
Patient ID: Stacie Adams, female   DOB: February 28, 1961, 53 y.o.   MRN: [id number]  [facility] PROGRESS NOTE  Stacie HectorRalphine K Putz [id number] DOB: February 28, 1961 DOA: 05/22/2014 PCP: No primary care provider on file.   Brief narrative: Patient is 53 year old female who is deaf, presented to [facility] with one week duration of progressively worsening dyspnea mostly with exertion, now present at rest, associated with subjective fevers and chills, cough productive of yellow sputum.  Assessment and Plan:    Principal Problem:  Acute respiratory failure secondary to community-acquired pneumonia - no specific pathogen identified at this point - Respiratory status is stable this morning, patient maintaining oxygen saturations at target range - continue Zithromax and Rocephin day 4, follow-up and urine cultures sputum analysis, urine Legionella and strep pneumo - Provide bronchodilators scheduled and as needed - pt is clinically improving, afebrile, WBC trending down  - pt reports felling better, respiratory status stable  - transition to oral Levaquin in AM and possible d/c home  Active Problems:  G+ cocci in clusters 1/2 blood cultures - from 12/14, likely contaminant, pt was changed to Vanc and Zosyn overnight 12/15, will d/c both - I d/w ID specialist Dr. Luciana Axeomer and he confirmed this is most likely contaminant, no need for repeat blood cultures - agreed with placing back on Zithromax and Rocephin   Diabetes mellitus without complication - Reasonable inpatient control  Bipolar disorder - Stable this morning   Hypertension - Reasonable inpatient control    Anemia of iron deficiency - Hg stable   Deaf - Interpreter at bedside   Thrombocytopenia  - Drop in platelets since admission - Lovenox for DVT prophylaxis discontinued and placed on SCDs - Plt now [facility]  Leukocytosis  - Secondary to pneumonia, antibiotics as noted above and repeat CBC in the morning    DVT  prophylaxis  SCD  Code Status: Full Family Communication: Pt at bedside Disposition Plan: Home when medically stable   IV Access:    Peripheral IV Procedures and diagnostic studies:    Dg Chest 2 View 05/22/2014 Indistinct airspace opacity in the right lower lobe, suspicious for pneumonia.   Ct Angio Chest Pe W/cm &/or Wo Cm 05/22/2014 No embolus identified. Reduced negative predictive value due to motion artifact and adequate but suboptimal contrast bolus. 2. Airspace opacities primarily in the right upper lobe but also with patchy opacities in both lower lobes and the right middle lobe, suspicious for multilobar pneumonia.  Medical Consultants:    None Other Consultants:    None Anti-Infectives:    Zithromax 12/14 -->  Rocephin 12.14 -->  Debbora PrestoMAGICK-Michaelangelo Mittelman, MD  [facility] Pager [phone]  If 7PM-7AM, please contact night-coverage [email or web address] Password [email or web address] 05/25/2014, 4:51 PM   LOS: 3 days   HPI/Subjective: No events overnight.   Objective: Filed Vitals:   05/25/14 0508 05/25/14 0835 05/25/14 0942 05/25/14 1412  BP: 123/68  116/68 135/77  Pulse: 81  86 84  Temp: 97.8 F (36.6 C)   97.7 F (36.5 C)  TempSrc: Oral   Oral  Resp: 18   20  Height:      Weight:      SpO2: 95% 95%  98%    Intake/Output Summary (Last 24 hours) at 05/25/14 1651 Last data filed at 05/25/14 1345  Gross per 24 hour  Intake    600 ml  Output      0 ml  Net    600 ml    Exam:   General:  Pt  is alert, follows commands appropriately, not in acute distress  Cardiovascular: Regular rate and rhythm, S1/S2, no murmurs, no rubs, no gallops  Respiratory: Clear to auscultation bilaterally, no wheezing, no crackles, no rhonchi  Abdomen: Soft, non tender, non distended, bowel sounds present, no guarding  Extremities: No edema, pulses DP and PT palpable bilaterally  Neuro: Grossly nonfocal  Data Reviewed: Basic Metabolic Panel:  Recent Labs Lab  05/22/14 2023 05/23/14 0452 05/24/14 0453 05/25/14 0356  NA 137 137 143 139  K 4.2 4.4 4.0 4.0  CL 97 100 106 101  CO2 24 23 25 25   GLUCOSE 151* 152* 83 127*  BUN 13 11 11 10   CREATININE 0.97 0.88 0.88 0.87  CALCIUM 9.3 8.5 8.7 9.1   Liver Function Tests:  Recent Labs Lab 05/22/14 2023  AST 9  ALT <5  ALKPHOS 51  BILITOT 0.2*  PROT 7.7  ALBUMIN 3.4*   No results for input(s): LIPASE, AMYLASE in the last 168 hours. No results for input(s): AMMONIA in the last 168 hours. CBC:  Recent Labs Lab 05/22/14 2023 05/23/14 0452 05/24/14 0453 05/25/14 0356  WBC 16.8* 12.3* 10.6* 10.9*  NEUTROABS 11.0* 8.3*  --   --   HGB 11.7* 11.0* 9.9* 10.3*  HCT 36.8 35.2* 29.9* 32.9*  MCV 77.8* 78.4 77.5* 77.2*  PLT 147* 97* 135* 191   Cardiac Enzymes:  Recent Labs Lab 05/22/14 2023  TROPONINI <0.30   BNP: Invalid input(s): POCBNP CBG:  Recent Labs Lab 05/24/14 0723 05/24/14 1152 05/24/14 1650 05/24/14 2208 05/25/14 0743  GLUCAP 92 190* 112* 159* 137*    Recent Results (from the past 240 hour(s))  Blood culture (routine x 2)     Status: None (Preliminary result)   Collection Time: 05/22/14 10:46 PM  Result Value Ref Range Status   Specimen Description BLOOD LEFT HAND  Final   Special Requests BOTTLES DRAWN AEROBIC AND ANAEROBIC 5CC  Final   Culture  Setup Time   Final    05/23/2014 04:43 Performed at [facility]    Culture   Final           BLOOD CULTURE RECEIVED NO GROWTH TO DATE CULTURE WILL BE HELD FOR 5 DAYS BEFORE ISSUING A FINAL NEGATIVE REPORT Performed at [facility]    Report Status PENDING  Incomplete  Blood culture (routine x 2)     Status: None   Collection Time: 05/22/14 10:46 PM  Result Value Ref Range Status   Specimen Description BLOOD RIGHT HAND  Final   Special Requests BOTTLES DRAWN AEROBIC AND ANAEROBIC 5CC  Final   Culture  Setup Time   Final    05/23/2014 04:43 Performed at [facility]    Culture   Final     STAPHYLOCOCCUS SPECIES (COAGULASE NEGATIVE) Note: THE SIGNIFICANCE OF ISOLATING THIS ORGANISM FROM A SINGLE SET OF BLOOD CULTURES WHEN MULTIPLE SETS ARE DRAWN IS UNCERTAIN. PLEASE NOTIFY THE MICROBIOLOGY DEPARTMENT WITHIN ONE WEEK IF SPECIATION AND SENSITIVITIES ARE REQUIRED. Note: Gram Stain Report Called to,Read Back By and Verified With: JOANNE SCOTTON ON 05/23/2014 AT 10:37P BY Serafina MitchellWILEJ Performed at [facility]    Report Status 05/25/2014 FINAL  Final     Scheduled Meds: . azithromycin  500 mg Intravenous Q24H  . buPROPion  300 mg Oral QHS  . cefTRIAXone (ROCEPHIN)  IV  1 g Intravenous Q24H  . divalproex  250 mg Oral BID  . docusate sodium  100 mg Oral Daily  . haloperidol  5 mg Oral QHS  . insulin aspart  0-9 Units Subcutaneous TID WC  . lisinopril  20 mg Oral Daily  . rosuvastatin  10 mg Oral QHS   Continuous Infusions:

## 2014-05-25 NOTE — Clinical Documentation Improvement (Signed)
"  Acute Respiratory Failure secondary to community acquired pneumonia" documented in progress note 05/23/14 and carried forward in consecutive progress notes.  Please clarify if Acute Respiratory Failure was:   - Present on Admission, including clinical indicators   - Not Present on Admission and developed during this hospitalization   - Unable to Clinically Determine  Thank You, Jerral Ralphathy R Tramell Piechota ,RN Clinical Documentation Specialist:  [phone] [facility]- Health Information Management

## 2014-05-26 LAB — BASIC METABOLIC PANEL
Anion gap: 12 (ref 5–15)
BUN: 9 mg/dL (ref 6–23)
CHLORIDE: 102 meq/L (ref 96–112)
CO2: 26 mEq/L (ref 19–32)
Calcium: 9.2 mg/dL (ref 8.4–10.5)
Creatinine, Ser: 0.84 mg/dL (ref 0.50–1.10)
GFR calc Af Amer: 89 mL/min — ABNORMAL LOW (ref >90)
GFR, EST NON AFRICAN AMERICAN: 77 mL/min — AB (ref >90)
Glucose, Bld: 123 mg/dL — ABNORMAL HIGH (ref 70–99)
Potassium: 4.3 mEq/L (ref 3.7–5.3)
SODIUM: 140 meq/L (ref 137–147)

## 2014-05-26 LAB — CBC
HCT: 33.4 % — ABNORMAL LOW (ref 36.0–46.0)
HEMOGLOBIN: 10.5 g/dL — AB (ref 12.0–15.0)
MCH: 24.1 pg — ABNORMAL LOW (ref 26.0–34.0)
MCHC: 31.4 g/dL (ref 30.0–36.0)
MCV: 76.8 fL — ABNORMAL LOW (ref 78.0–100.0)
Platelets: 192 10*3/uL (ref 150–400)
RBC: 4.35 MIL/uL (ref 3.87–5.11)
RDW: 15.4 % (ref 11.5–15.5)
WBC: 9.7 10*3/uL (ref 4.0–10.5)

## 2014-05-26 LAB — GLUCOSE, CAPILLARY: Glucose-Capillary: 199 mg/dL — ABNORMAL HIGH (ref 70–99)

## 2014-05-26 MED ORDER — LEVOFLOXACIN 750 MG PO TABS: 750.0000 mg | ORAL_TABLET | Freq: Every day | ORAL | Status: DC

## 2014-05-26 MED ORDER — INSULIN ASPART PROT & ASPART (70-30 MIX) 100 UNIT/ML ~~LOC~~ SUSP: 20.0000 [IU] | Freq: Three times a day (TID) | SUBCUTANEOUS | Status: DC

## 2014-05-26 NOTE — Progress Notes (Signed)
PT Cancellation Note  Patient Details Name: Stacie HectorRalphine K Biondo MRN: [id number] DOB: 1961-04-28   Cancelled Treatment:    Reason Eval/Treat Not Completed: PT screened, no needs identified, will sign off   [facility] Rock[facility] AveWILLIAMS,Miniya Miguez 05/26/2014, 11:42 AM

## 2014-05-26 NOTE — Discharge Summary (Signed)
Physician Discharge Summary  Stacie Adams BJY:[id number] DOB: 25-May-1961 DOA: 05/22/2014  PCP: No primary care provider on file.  Admit date: 05/22/2014 Discharge date: 05/26/2014  Recommendations for Outpatient Follow-up:  Take Levaquin for 6 days on discharge for treatment of pneumonia.  Discharge Diagnoses:  Principal Problem:   CAP (community acquired pneumonia) Active Problems:   Diabetes mellitus without complication   Bipolar disorder   Hypertension   Hypoxia   Deaf    Discharge Condition: stable   Diet recommendation: as tolerated   History of present illness:  Patient is 53 year old female who is deaf, presented to [facility] with one week duration of progressively worsening dyspnea mostly with exertion, now present at rest, associated with subjective fevers and chills, cough productive of yellow sputum.  Assessment and Plan:    Principal Problem:  Acute respiratory failure secondary to community-acquired pneumonia / leukocytosis - Please note that patient did not have hypoxia on the admission and her symptoms have improved since admission. - Respiratory status remains stable. Patient maintains oxygen saturation above 90% - Patient has received Zithromax and Rocephin for 4 days and will continue Levaquin on discharge for 6 days - Legionella is negative and blood cultures so far negative.  - White blood cell count normalized  Active Problems:  G+ cocci in clusters 1/2 blood cultures - from 12/14, likely contaminant, pt was changed to Vanc and Zosyn overnight 12/15 but this was subsequently discontinued the following morning. - Gram-positive cocci in clusters likely contaminant and there is no need to repeat blood cultures. This was confirmed with infectious disease.   Diabetes mellitus without complication - Reasonable inpatient control - Patient may resume insulin 3 times daily but instead of 25 units 3 times daily she can use 20 units 3 times  daily because blood sugars were reasonably controlled throughout the hospital stay at which time she used sliding scale insulin only. - CBGs prior to discharge are 199, 167, 167   Bipolar disorder - Stable this morning    Hypertension - Reasonable inpatient control    Anemia of iron deficiency - Hg stable    Thrombocytopenia  - Drop in platelets since admission - Lovenox for DVT prophylaxis discontinued and placed on SCDs - Plt now WL  DVT prophylaxis  SCD  Code Status: Full Family Communication: Pt at bedside    IV Access:    Peripheral IV Procedures and diagnostic studies:    Dg Chest 2 View 05/22/2014 Indistinct airspace opacity in the right lower lobe, suspicious for pneumonia.   Ct Angio Chest Pe W/cm &/or Wo Cm 05/22/2014 No embolus identified. Reduced negative predictive value due to motion artifact and adequate but suboptimal contrast bolus. 2. Airspace opacities primarily in the right upper lobe but also with patchy opacities in both lower lobes and the right middle lobe, suspicious for multilobar pneumonia.  Medical Consultants:    None Other Consultants:    None Anti-Infectives:    Zithromax 12/14 --> 05/26/2014   Rocephin 12.14 --> 05/26/2014    Signed:  Manson Passey, MD  [facility] 05/26/2014, 11:29 AM  Pager #: [phone]   Discharge Exam: Filed Vitals:   05/26/14 0634  BP: 118/67  Pulse: 81  Temp: 97.8 F (36.6 C)  Resp: 20   Filed Vitals:   05/25/14 0942 05/25/14 1412 05/25/14 2115 05/26/14 0634  BP: 116/68 135/77 126/80 118/67  Pulse: 86 84 92 81  Temp:  97.7 F (36.5 C) 98.2 F (36.8 C) 97.8 F (36.6 C)  TempSrc:  Oral Oral Oral  Resp:  20 18 20   Height:      Weight:      SpO2:  98% 99% 98%    General: Pt is alert, follows commands appropriately, not in acute distress Cardiovascular: Regular rate and rhythm, S1/S2 +, no murmurs Respiratory: Clear to auscultation bilaterally,  no wheezing, no crackles, no rhonchi Abdominal: Soft, non tender, non distended, bowel sounds +, no guarding Extremities: no edema, no cyanosis, pulses palpable bilaterally DP and PT Neuro: Grossly nonfocal  Discharge Instructions  Discharge Instructions    Call MD for:  difficulty breathing, headache or visual disturbances    Complete by:  As directed      Call MD for:  persistant dizziness or light-headedness    Complete by:  As directed      Call MD for:  persistant nausea and vomiting    Complete by:  As directed      Call MD for:  severe uncontrolled pain    Complete by:  As directed      Diet - low sodium heart healthy    Complete by:  As directed      Discharge instructions    Complete by:  As directed   1. Take Levaquin for 6 days on discharge for treatment of pneumonia.     Increase activity slowly    Complete by:  As directed             Medication List    TAKE these medications        buPROPion 300 MG 24 hr tablet  Commonly known as:  WELLBUTRIN XL  Take 300 mg by mouth at bedtime.     divalproex 250 MG DR tablet  Commonly known as:  DEPAKOTE  Take 250 mg by mouth 2 (two) times daily.     haloperidol 5 MG tablet  Commonly known as:  HALDOL  Take 5 mg by mouth at bedtime.     insulin aspart protamine- aspart (70-30) 100 UNIT/ML injection  Commonly known as:  NOVOLOG MIX 70/30  Inject 0.2 mLs (20 Units total) into the skin 3 (three) times daily.     levofloxacin 750 MG tablet  Commonly known as:  LEVAQUIN  Take 1 tablet (750 mg total) by mouth daily.     lisinopril 20 MG tablet  Commonly known as:  PRINIVIL,ZESTRIL  Take 20 mg by mouth daily.     rosuvastatin 10 MG tablet  Commonly known as:  CRESTOR  Take 10 mg by mouth at bedtime.     STOOL SOFTENER 100 MG capsule  Generic drug:  docusate sodium  Take 100 mg by mouth daily.     Vitamin D3 5000 UNITS Caps  Take 1 capsule by mouth daily.          The results of significant diagnostics  from this hospitalization (including imaging, microbiology, ancillary and laboratory) are listed below for reference.    Significant Diagnostic Studies: Dg Chest 2 View  05/22/2014   CLINICAL DATA:  Nonproductive cough. Hypertension. Diabetes. Right upper back pain.  EXAM: CHEST  2 VIEW  COMPARISON:  10/04/2009  FINDINGS: The patient is rotated to the Right on today's radiograph, reducing diagnostic sensitivity and specificity. Indistinct airspace opacity observed in the right lower lobe, suspicious for pneumonia.  Thoracic spondylosis. Cardiac and mediastinal margins appear normal. No pleural effusion identified.  IMPRESSION: 1. Indistinct airspace opacity in the right lower lobe, suspicious for pneumonia. Followup chest radiography is recommended  in 4 weeks time to ensure resolution and exclude underlying malignancy.   Electronically Signed   By: Herbie BaltimoreWalt  Liebkemann M.D.   On: 05/22/2014 20:47   Ct Angio Chest Pe W/cm &/or Wo Cm  05/22/2014   CLINICAL DATA:  Chest pain. Chest soreness. Dry cough. Elevated D-dimer. Right back pain.  EXAM: CT ANGIOGRAPHY CHEST WITH CONTRAST  TECHNIQUE: Multidetector CT imaging of the chest was performed using the standard protocol during bolus administration of intravenous contrast. Multiplanar CT image reconstructions and MIPs were obtained to evaluate the vascular anatomy.  CONTRAST:  100mL OMNIPAQUE IOHEXOL 350 MG/ML SOLN  COMPARISON:  05/22/2014; 06/04/2004  FINDINGS: Suboptimally late contrast bolus and suboptimal breathing motion. Pulmonary arterial contrast is adequate if not ideal. I am doubtful the exam would be much better if we repeated it.  Borderline enlargement of the cardiopericardial silhouette no acute aortic findings are identified. Mild intrahepatic biliary dilatation.  There is airspace opacity posteriorly in the right upper lobe. There is likely some volume loss and airspace opacity in the right middle lobe. Indistinct airspace opacity is present in both  lower lobes with some degree of volume loss in the lower lobes.  Review of the MIP images confirms the above findings.  IMPRESSION: 1. No embolus identified. Reduced negative predictive value due to motion artifact and adequate but suboptimal contrast bolus. 2. Airspace opacities primarily in the right upper lobe but also with patchy opacities in both lower lobes and the right middle lobe, suspicious for multilobar pneumonia.   Electronically Signed   By: Herbie BaltimoreWalt  Liebkemann M.D.   On: 05/22/2014 22:14    Microbiology: Recent Results (from the past 240 hour(s))  Blood culture (routine x 2)     Status: None (Preliminary result)   Collection Time: 05/22/14 10:46 PM  Result Value Ref Range Status   Specimen Description BLOOD LEFT HAND  Final   Special Requests BOTTLES DRAWN AEROBIC AND ANAEROBIC 5CC  Final   Culture  Setup Time   Final    05/23/2014 04:43 Performed at [facility]    Culture   Final           BLOOD CULTURE RECEIVED NO GROWTH TO DATE CULTURE WILL BE HELD FOR 5 DAYS BEFORE ISSUING A FINAL NEGATIVE REPORT Performed at [facility]    Report Status PENDING  Incomplete  Blood culture (routine x 2)     Status: None   Collection Time: 05/22/14 10:46 PM  Result Value Ref Range Status   Specimen Description BLOOD RIGHT HAND  Final   Special Requests BOTTLES DRAWN AEROBIC AND ANAEROBIC 5CC  Final   Culture  Setup Time   Final   Culture   Final    STAPHYLOCOCCUS SPECIES (COAGULASE NEGATIVE)     Report Status 05/25/2014 FINAL  Final     Labs: Basic Metabolic Panel:  Recent Labs Lab 05/22/14 2023 05/23/14 0452 05/24/14 0453 05/25/14 0356 05/26/14 0440  NA 137 137 143 139 140  K 4.2 4.4 4.0 4.0 4.3  CL 97 100 106 101 102  CO2 24 23 25 25 26   GLUCOSE 151* 152* 83 127* 123*  BUN 13 11 11 10 9   CREATININE 0.97 0.88 0.88 0.87 0.84  CALCIUM 9.3 8.5 8.7 9.1 9.2   Liver Function Tests:  Recent Labs Lab 05/22/14 2023  AST 9  ALT <5  ALKPHOS 51  BILITOT  0.2*  PROT 7.7  ALBUMIN 3.4*   No results for input(s): LIPASE, AMYLASE in the last  168 hours. No results for input(s): AMMONIA in the last 168 hours. CBC:  Recent Labs Lab 05/22/14 2023 05/23/14 0452 05/24/14 0453 05/25/14 0356 05/26/14 0440  WBC 16.8* 12.3* 10.6* 10.9* 9.7  NEUTROABS 11.0* 8.3*  --   --   --   HGB 11.7* 11.0* 9.9* 10.3* 10.5*  HCT 36.8 35.2* 29.9* 32.9* 33.4*  MCV 77.8* 78.4 77.5* 77.2* 76.8*  PLT 147* 97* 135* 191 192   Cardiac Enzymes:  Recent Labs Lab 05/22/14 2023  TROPONINI <0.30   BNP: BNP (last 3 results) No results for input(s): PROBNP in the last 8760 hours. CBG:  Recent Labs Lab 05/25/14 0743 05/25/14 1153 05/25/14 1702 05/25/14 2113 05/26/14 0747  GLUCAP 137* 216* 169* 167* 199*    Time coordinating discharge: Over 30 minutes

## 2014-05-26 NOTE — Discharge Instructions (Signed)

## 2014-05-29 LAB — CULTURE, BLOOD (ROUTINE X 2): CULTURE: NO GROWTH

## 2014-06-20 DIAGNOSIS — F250 Schizoaffective disorder, bipolar type: Secondary | ICD-10-CM | POA: Diagnosis not present

## 2014-07-05 ENCOUNTER — Encounter: Payer: Self-pay | Admitting: Family

## 2014-07-05 ENCOUNTER — Other Ambulatory Visit (INDEPENDENT_AMBULATORY_CARE_PROVIDER_SITE_OTHER): Payer: Medicare Other

## 2014-07-05 ENCOUNTER — Ambulatory Visit (INDEPENDENT_AMBULATORY_CARE_PROVIDER_SITE_OTHER): Payer: Medicare Other | Admitting: Family

## 2014-07-05 VITALS — BP 140/98 | HR 94 | Temp 97.7°F | Resp 18 | Ht 64.0 in | Wt 181.3 lb

## 2014-07-05 DIAGNOSIS — E119 Type 2 diabetes mellitus without complications: Secondary | ICD-10-CM | POA: Diagnosis not present

## 2014-07-05 DIAGNOSIS — E1165 Type 2 diabetes mellitus with hyperglycemia: Secondary | ICD-10-CM

## 2014-07-05 DIAGNOSIS — IMO0002 Reserved for concepts with insufficient information to code with codable children: Secondary | ICD-10-CM

## 2014-07-05 DIAGNOSIS — E785 Hyperlipidemia, unspecified: Secondary | ICD-10-CM

## 2014-07-05 DIAGNOSIS — I10 Essential (primary) hypertension: Secondary | ICD-10-CM

## 2014-07-05 LAB — BASIC METABOLIC PANEL
BUN: 17 mg/dL (ref 6–23)
CALCIUM: 9.3 mg/dL (ref 8.4–10.5)
CO2: 29 meq/L (ref 19–32)
Chloride: 102 mEq/L (ref 96–112)
Creatinine, Ser: 1.06 mg/dL (ref 0.40–1.20)
GFR: 69 mL/min (ref >60.00)
GLUCOSE: 144 mg/dL — AB (ref 70–99)
POTASSIUM: 4.3 meq/L (ref 3.5–5.1)
Sodium: 137 mEq/L (ref 135–145)

## 2014-07-05 LAB — MICROALBUMIN / CREATININE URINE RATIO
CREATININE, U: 149.4 mg/dL
MICROALB/CREAT RATIO: 0.5 mg/g (ref 0.0–30.0)
Microalb, Ur: 0.7 mg/dL (ref 0.0–1.9)

## 2014-07-05 LAB — HEMOGLOBIN A1C: Hgb A1c MFr Bld: 8.1 % — ABNORMAL HIGH (ref 4.6–6.5)

## 2014-07-05 MED ORDER — GLUCOSE BLOOD VI STRP: ORAL_STRIP | Status: DC

## 2014-07-05 MED ORDER — INSULIN ASPART PROT & ASPART (70-30 MIX) 100 UNIT/ML PEN: 25.0000 [IU] | PEN_INJECTOR | Freq: Two times a day (BID) | SUBCUTANEOUS | Status: DC

## 2014-07-05 NOTE — Assessment & Plan Note (Addendum)
Obtain hemoglobin A1c, basic metabolic profile, and urine microalbumin. Continue current dosage of NovoLog 70/30. Diabetic foot exam completed. Referral for ophthalmology made for diabetic eye exam. One Touch meter provided to patient today. Instructed to check her blood sugar twice daily.

## 2014-07-05 NOTE — Patient Instructions (Signed)
Thank you for choosing [facility].  Summary/Instructions:  Your prescription(s) have been submitted to your pharmacy or been printed and provided for you. Please take as directed and contact our office if you believe you are having problem(s) with the medication(s) or have any questions.  If your symptoms worsen or fail to improve, please contact our office for further instruction, or in case of emergency go directly to the emergency room at the closest medical facility.     

## 2014-07-05 NOTE — Progress Notes (Signed)
Pre visit review using our clinic review tool, if applicable. No additional management support is needed unless otherwise documented below in the visit note. 

## 2014-07-05 NOTE — Assessment & Plan Note (Signed)
Stable with current regimen. Continue Crestor at current dosage. Need to obtain new lipid panel when fasting.

## 2014-07-05 NOTE — Progress Notes (Signed)
Subjective:    Patient ID: Stacie Adams, female    DOB: 10/24/1960, 54 y.o.   MRN: [id number]  Chief Complaint  Patient presents with  . Establish Care    no issues, diabetic, needs a meter, needs to talk about novalog     HPI:  Stacie Adams is a 54 y.o. female who presents today to establish care and discuss diabetes. Patient's daughter and sign language interpreter are present for today's visit.  1) Diabetes - diagnosed about 4 years ago and is currently maintained on novolog 70/30 and is taking it taking approximately 1 time per day. Due for eye exam and foot exam. Has never been tried on oral medications.   2) Hypertension - currently maintained on lisinopril.   BP Readings from Last 3 Encounters:  07/05/14 140/98  05/26/14 118/67    3) Hyperlipidemia - currently stable and maintained on crestor 10 mg.    No Known Allergies  Current Outpatient Prescriptions on File Prior to Visit  Medication Sig Dispense Refill  . buPROPion (WELLBUTRIN XL) 300 MG 24 hr tablet Take 300 mg by mouth at bedtime.    . Cholecalciferol (VITAMIN D3) 5000 UNITS CAPS Take 1 capsule by mouth daily.    Marland Kitchen docusate sodium (STOOL SOFTENER) 100 MG capsule Take 100 mg by mouth daily.    . haloperidol (HALDOL) 5 MG tablet Take 5 mg by mouth at bedtime.    Marland Kitchen lisinopril (PRINIVIL,ZESTRIL) 20 MG tablet Take 20 mg by mouth daily.    . rosuvastatin (CRESTOR) 10 MG tablet Take 10 mg by mouth at bedtime.     No current facility-administered medications on file prior to visit.    Past Medical History  Diagnosis Date  . Diabetes mellitus without complication   . Bipolar disorder   . Hypertension     No past surgical history on file.  Family History  Problem Relation Age of Onset  . Hypertension Mother   . Diabetes Mother   . Hypertension Father   . Diabetes Father   . Cancer Sister   . Cancer Brother   . Diabetes Other     History   Social History  . Marital Status: Legally Separated     Spouse Name: N/A    Number of Children: 2  . Years of Education: 14   Occupational History  . Disability    Social History Main Topics  . Smoking status: Former Smoker -- 1.00 packs/day for 2 years    Quit date: 07/05/2004  . Smokeless tobacco: Never Used  . Alcohol Use: No     Comment: rare  . Drug Use: No  . Sexual Activity: Not on file   Other Topics Concern  . Not on file   Social History Narrative   Born and raised in [place], [place]. Currently resides in a house with her daughter and grandson. 1 dog. Fun: Shopping, partying    Denies any religious beliefs that would effect health c    Review of Systems  Eyes:       Denies changes in vision  Respiratory: Negative for chest tightness.   Cardiovascular: Negative for chest pain, palpitations and leg swelling.  Endocrine: Negative for polydipsia, polyphagia and polyuria.  Musculoskeletal: Negative for myalgias.  Neurological: Negative for numbness.      Objective:    BP 140/98 mmHg  Pulse 94  Temp(Src) 97.7 F (36.5 C) (Oral)  Resp 18  Ht  (1.626 m)  Wt 181 lb 4.8  oz (82.237 kg)  BMI 31.10 kg/m2  SpO2 99% Nursing note and vital signs reviewed.  Physical Exam  Constitutional: She is oriented to person, place, and time. She appears well-developed and well-nourished. No distress.  Cardiovascular: Normal rate, regular rhythm, normal heart sounds and intact distal pulses.   Pulmonary/Chest: Effort normal and breath sounds normal.  Neurological: She is alert and oriented to person, place, and time.  Diabetic foot exam: Bilateral feet are free from skin breakdown or abrasions. Pulses are intact and appropriate bilaterally. Sensation is intact to monofilament.  Skin: Skin is warm and dry.  Psychiatric: She has a normal mood and affect. Her behavior is normal. Judgment and thought content normal.       Assessment & Plan:

## 2014-07-05 NOTE — Assessment & Plan Note (Signed)
Blood pressure appears stable although slightly elevated today. Continue current dosage of lisinopril. Obtain basic metabolic panel. Referral to ophthalmology made for optic exam.

## 2014-07-06 ENCOUNTER — Telehealth: Payer: Self-pay | Admitting: Family

## 2014-07-06 MED ORDER — METFORMIN HCL 500 MG PO TABS: 500.0000 mg | ORAL_TABLET | Freq: Two times a day (BID) | ORAL | Status: DC

## 2014-07-06 NOTE — Telephone Encounter (Signed)
Tried calling pt and left VM for her to call back.  

## 2014-07-06 NOTE — Telephone Encounter (Signed)
Please inform the patient that her kidney function and electrolytes looked good. Her hemoglobin A1c was 8.1 with a goal of <7.0 which indicates that we have some more work to do to decrease her blood sugars. So what I would like to do is add a medication called metformin which is a pill to her regimen and continue the insulin 70/30 at the current dose for a little while longer. Then in about 3 months we will recheck the A1c and see about decreasing the insulin.

## 2014-07-10 DIAGNOSIS — F250 Schizoaffective disorder, bipolar type: Secondary | ICD-10-CM | POA: Diagnosis not present

## 2014-07-10 NOTE — Telephone Encounter (Signed)
Pt daughter called back in   [phone] Daughter

## 2014-07-10 NOTE — Telephone Encounter (Signed)
Spoke with pts daughter and let her know the results of lab work. Also I am mailing the results which the daughter is aware of.

## 2014-07-17 ENCOUNTER — Other Ambulatory Visit: Payer: Self-pay

## 2014-07-17 ENCOUNTER — Telehealth: Payer: Self-pay | Admitting: Family

## 2014-07-17 DIAGNOSIS — E119 Type 2 diabetes mellitus without complications: Secondary | ICD-10-CM

## 2014-07-17 MED ORDER — GLUCOSE BLOOD VI STRP: ORAL_STRIP | Status: DC

## 2014-07-17 NOTE — Telephone Encounter (Signed)
Is requesting test strips to be sent to [facility] on [address].

## 2014-07-17 NOTE — Telephone Encounter (Signed)
Test strips sent in. 

## 2014-07-18 ENCOUNTER — Other Ambulatory Visit: Payer: Self-pay

## 2014-07-18 DIAGNOSIS — E119 Type 2 diabetes mellitus without complications: Secondary | ICD-10-CM

## 2014-07-19 MED ORDER — GLUCOSE BLOOD VI STRP: ORAL_STRIP | Status: DC

## 2014-07-24 ENCOUNTER — Other Ambulatory Visit: Payer: Self-pay | Admitting: Family

## 2014-07-24 DIAGNOSIS — E119 Type 2 diabetes mellitus without complications: Secondary | ICD-10-CM

## 2014-07-24 MED ORDER — GLUCOSE BLOOD VI STRP: ORAL_STRIP | Status: DC

## 2014-07-24 NOTE — Telephone Encounter (Signed)
States insurance will not cover patients test strips b/c of the way the script is written out.  Is requesting a call to patients pharmacy - [facility] on [address].

## 2014-07-24 NOTE — Telephone Encounter (Signed)
rx printed for PCP to sign. This will need to be faxed to pharmacy.   FYI: Medicare needs the dx and testing frequency to pay for dm testing supplies.

## 2014-08-01 ENCOUNTER — Other Ambulatory Visit: Payer: Self-pay | Admitting: Family

## 2014-08-10 ENCOUNTER — Other Ambulatory Visit: Payer: Self-pay | Admitting: Family

## 2014-08-28 DIAGNOSIS — H40013 Open angle with borderline findings, low risk, bilateral: Secondary | ICD-10-CM | POA: Diagnosis not present

## 2014-08-28 DIAGNOSIS — E119 Type 2 diabetes mellitus without complications: Secondary | ICD-10-CM | POA: Diagnosis not present

## 2014-08-30 DIAGNOSIS — F250 Schizoaffective disorder, bipolar type: Secondary | ICD-10-CM | POA: Diagnosis not present

## 2014-09-01 DIAGNOSIS — F250 Schizoaffective disorder, bipolar type: Secondary | ICD-10-CM | POA: Diagnosis not present

## 2014-09-07 ENCOUNTER — Other Ambulatory Visit: Payer: Self-pay | Admitting: Family

## 2014-09-08 ENCOUNTER — Telehealth: Payer: Self-pay | Admitting: Family

## 2014-09-08 DIAGNOSIS — E119 Type 2 diabetes mellitus without complications: Secondary | ICD-10-CM

## 2014-09-08 MED ORDER — ROSUVASTATIN CALCIUM 10 MG PO TABS: 10.0000 mg | ORAL_TABLET | Freq: Every day | ORAL | Status: DC

## 2014-09-08 MED ORDER — GLUCOSE BLOOD VI STRP: ORAL_STRIP | Status: DC

## 2014-09-08 MED ORDER — LISINOPRIL 20 MG PO TABS: 20.0000 mg | ORAL_TABLET | Freq: Every morning | ORAL | Status: DC

## 2014-09-08 MED ORDER — METFORMIN HCL 500 MG PO TABS: ORAL_TABLET | ORAL | Status: DC

## 2014-09-08 NOTE — Telephone Encounter (Signed)
Pt needed refills.   Crestor, metformin, test strips and lisinopril erx to [facility] on [address]

## 2014-09-08 NOTE — Telephone Encounter (Signed)
Pt's daughter called stated she need refill for Mis Fogg test strip and crestor. Please call pt's daughter because she need to speak you the assistant concern about [name].

## 2014-09-27 DIAGNOSIS — F250 Schizoaffective disorder, bipolar type: Secondary | ICD-10-CM | POA: Diagnosis not present

## 2014-10-03 DIAGNOSIS — H4011X1 Primary open-angle glaucoma, mild stage: Secondary | ICD-10-CM | POA: Diagnosis not present

## 2014-10-04 ENCOUNTER — Encounter: Payer: Self-pay | Admitting: Family

## 2014-10-04 ENCOUNTER — Other Ambulatory Visit (INDEPENDENT_AMBULATORY_CARE_PROVIDER_SITE_OTHER): Payer: Medicare Other

## 2014-10-04 ENCOUNTER — Ambulatory Visit (INDEPENDENT_AMBULATORY_CARE_PROVIDER_SITE_OTHER): Payer: Medicare Other | Admitting: Family

## 2014-10-04 ENCOUNTER — Other Ambulatory Visit: Payer: Medicare Other

## 2014-10-04 VITALS — BP 124/90 | HR 84 | Temp 97.6°F | Resp 18 | Ht 64.0 in | Wt 177.0 lb

## 2014-10-04 DIAGNOSIS — R300 Dysuria: Secondary | ICD-10-CM | POA: Diagnosis not present

## 2014-10-04 DIAGNOSIS — E119 Type 2 diabetes mellitus without complications: Secondary | ICD-10-CM | POA: Diagnosis not present

## 2014-10-04 LAB — HEMOGLOBIN A1C: HEMOGLOBIN A1C: 10.8 % — AB (ref 4.6–6.5)

## 2014-10-04 LAB — POCT URINALYSIS DIPSTICK
BILIRUBIN UA: NEGATIVE
NITRITE UA: NEGATIVE
Protein, UA: NEGATIVE
Urobilinogen, UA: NEGATIVE
pH, UA: 6

## 2014-10-04 MED ORDER — CIPROFLOXACIN HCL 250 MG PO TABS: 250.0000 mg | ORAL_TABLET | Freq: Two times a day (BID) | ORAL | Status: DC

## 2014-10-04 MED ORDER — DIVALPROEX SODIUM 500 MG PO DR TAB: 500.0000 mg | DELAYED_RELEASE_TABLET | Freq: Two times a day (BID) | ORAL | Status: DC

## 2014-10-04 MED ORDER — GLUCOSE BLOOD VI STRP: ORAL_STRIP | Status: DC

## 2014-10-04 MED ORDER — BUPROPION HCL ER (XL) 300 MG PO TB24: 300.0000 mg | ORAL_TABLET | Freq: Every day | ORAL | Status: DC

## 2014-10-04 MED ORDER — INSULIN PEN NEEDLE 32G X 4 MM MISC: Status: DC

## 2014-10-04 NOTE — Progress Notes (Signed)
Pre visit review using our clinic review tool, if applicable. No additional management support is needed unless otherwise documented below in the visit note. 

## 2014-10-04 NOTE — Progress Notes (Signed)
Subjective:    Patient ID: Stacie Adams, female    DOB: [birth year], 54 y.o.   MRN: [id number]  Chief Complaint  Patient presents with  . Follow-up    needs more testing strips for glucose monitor, says her sugars have been good, has had vaginal symptoms, dysuria, and discoloration of urin    HPI:  Stacie Adams is a 54 y.o. female with a PMH of deafness, hypertension, Type 2 diabetes, and bipolar disorder who presents today for a follow up office visit.  She is present today with a sign language interpreter.   1) Diabetes - Indicates that her sugars at home have been below 150 majority of the time.  She is currently maintained on insulin aspart protamine 70/30 at 25 units twice daily.  Denies any adverse effects of medication or hypoglycemic events.   Lab Results  Component Value Date   HGBA1C 8.1* 07/05/2014    2) Urinary symptoms - This is a new problem. Associated symptom of dysuria and discolored urine has been going on for about 2 weeks. Denies any back pain, chills or fever.    No Known Allergies   Current Outpatient Prescriptions on File Prior to Visit  Medication Sig Dispense Refill  . buPROPion (WELLBUTRIN XL) 300 MG 24 hr tablet Take 300 mg by mouth at bedtime.    . Cholecalciferol (VITAMIN D3) 5000 UNITS CAPS Take 1 capsule by mouth daily.    . divalproex (DEPAKOTE) 500 MG DR tablet Take 500 mg by mouth 2 (two) times daily.    Marland Kitchen. docusate sodium (STOOL SOFTENER) 100 MG capsule Take 100 mg by mouth daily.    Marland Kitchen. glucose blood (ONETOUCH VERIO) test strip Use Blood Sugar Test Strips to Test blood sugar at least twice daily. Dx: E11.9 200 each 3  . haloperidol (HALDOL) 5 MG tablet Take 5 mg by mouth at bedtime.    . insulin aspart protamine - aspart (NOVOLOG 70/30 MIX) (70-30) 100 UNIT/ML FlexPen Inject 0.25 mLs (25 Units total) into the skin 2 (two) times daily. 15 mL 11  . lisinopril (PRINIVIL,ZESTRIL) 20 MG tablet Take 1 tablet (20 mg total) by mouth every morning.  90 tablet 1  . metFORMIN (GLUCOPHAGE) 500 MG tablet TAKE 1 TABLET (500 MG TOTAL) BY MOUTH 2 (TWO) TIMES DAILY WITH A MEAL. 180 tablet 3  . rosuvastatin (CRESTOR) 10 MG tablet Take 1 tablet (10 mg total) by mouth at bedtime. 90 tablet 1   No current facility-administered medications on file prior to visit.    Review of Systems  Endocrine: Positive for polyphagia and polyuria.  Genitourinary: Positive for dysuria, urgency and frequency.      Objective:    BP 124/90 mmHg  Pulse 84  Temp(Src) 97.6 F (36.4 C) (Oral)  Resp 18  Ht 5\' 4"  (1.626 m)  Wt 177 lb (80.287 kg)  BMI 30.37 kg/m2  SpO2 99% Nursing note and vital signs reviewed.  Physical Exam  Constitutional: She is oriented to person, place, and time. She appears well-developed and well-nourished. No distress.  Cardiovascular: Normal rate, regular rhythm, normal heart sounds and intact distal pulses.   Pulmonary/Chest: Effort normal and breath sounds normal.  Abdominal: There is no CVA tenderness.  Neurological: She is alert and oriented to person, place, and time.  Skin: Skin is warm and dry.  Psychiatric: She has a normal mood and affect. Her behavior is normal. Judgment and thought content normal.       Assessment & Plan:

## 2014-10-04 NOTE — Assessment & Plan Note (Signed)
Diabetes appears to be fairly well controlled with home readings averaging below 150. The goal being 80-130. Obtain A1c. Diabetic eye exam completed yesterday indicating glaucoma and some retinal damage for which she is being treated. Follow-up pending lab work

## 2014-10-04 NOTE — Patient Instructions (Signed)
Thank you for choosing [facility].  Summary/Instructions:  Your prescription(s) have been submitted to your pharmacy or been printed and provided for you. Please take as directed and contact our office if you believe you are having problem(s) with the medication(s) or have any questions.  Please stop by the lab on the basement level of the building for your blood work. Your results will be released to MyChart (or called to you) after review, usually within 72 hours after test completion. If any changes need to be made, you will be notified at that same time.  If your symptoms worsen or fail to improve, please contact our office for further instruction, or in case of emergency go directly to the emergency room at the closest medical facility.     

## 2014-10-04 NOTE — Assessment & Plan Note (Signed)
In office urinalysis positive for leukocytes and glucose. Start Cipro. Patient instructed drink plenty water. Urine sent for culture. Follow-up if symptoms worsen or fail to improve.

## 2014-10-05 ENCOUNTER — Telehealth: Payer: Self-pay | Admitting: Family

## 2014-10-05 DIAGNOSIS — H9193 Unspecified hearing loss, bilateral: Secondary | ICD-10-CM | POA: Diagnosis not present

## 2014-10-05 NOTE — Telephone Encounter (Signed)
LVM for pt to call back.

## 2014-10-05 NOTE — Telephone Encounter (Signed)
Please inform the patient that her A1c has increased from 8.1 to 10.8 which is puzzling if she is taking her medications as prescribed. Therefore please continue current insulin and medication and we will follow up in a couple of weeks. Please have her bring her sugar logs.

## 2014-10-06 LAB — URINE CULTURE: Colony Count: 50000

## 2014-10-06 NOTE — Telephone Encounter (Signed)
Pts daughter is aware of results. Will call back to schedule a follow up.

## 2014-10-07 ENCOUNTER — Telehealth: Payer: Self-pay | Admitting: Family

## 2014-10-07 NOTE — Telephone Encounter (Signed)
Please notify the patient that her urine culture did show bacteria, but no specific bacteria. If she continues to experience symptoms to please let us know.

## 2014-10-11 DIAGNOSIS — F250 Schizoaffective disorder, bipolar type: Secondary | ICD-10-CM | POA: Diagnosis not present

## 2014-10-11 NOTE — Telephone Encounter (Signed)
LVM of results

## 2014-11-14 DIAGNOSIS — H4011X1 Primary open-angle glaucoma, mild stage: Secondary | ICD-10-CM | POA: Diagnosis not present

## 2014-11-20 DIAGNOSIS — F250 Schizoaffective disorder, bipolar type: Secondary | ICD-10-CM | POA: Diagnosis not present

## 2014-11-24 ENCOUNTER — Telehealth: Payer: Self-pay

## 2014-11-24 NOTE — Telephone Encounter (Signed)
LVM letting pt know she is due for mammogram

## 2014-11-27 DIAGNOSIS — F250 Schizoaffective disorder, bipolar type: Secondary | ICD-10-CM | POA: Diagnosis not present

## 2014-12-01 ENCOUNTER — Telehealth: Payer: Self-pay

## 2014-12-01 NOTE — Telephone Encounter (Signed)
Contacted pt about making an appt for HgA1C recheck

## 2014-12-20 DIAGNOSIS — F250 Schizoaffective disorder, bipolar type: Secondary | ICD-10-CM | POA: Diagnosis not present

## 2015-03-01 DIAGNOSIS — F250 Schizoaffective disorder, bipolar type: Secondary | ICD-10-CM | POA: Diagnosis not present

## 2015-03-04 ENCOUNTER — Other Ambulatory Visit: Payer: Self-pay | Admitting: Family

## 2015-03-05 ENCOUNTER — Other Ambulatory Visit (INDEPENDENT_AMBULATORY_CARE_PROVIDER_SITE_OTHER): Payer: Medicare Other

## 2015-03-05 ENCOUNTER — Telehealth: Payer: Self-pay | Admitting: Family

## 2015-03-05 ENCOUNTER — Ambulatory Visit (INDEPENDENT_AMBULATORY_CARE_PROVIDER_SITE_OTHER): Payer: Medicare Other | Admitting: Family

## 2015-03-05 ENCOUNTER — Encounter: Payer: Self-pay | Admitting: Family

## 2015-03-05 VITALS — BP 158/98 | HR 104 | Temp 98.4°F | Resp 18 | Ht 64.0 in | Wt 161.1 lb

## 2015-03-05 DIAGNOSIS — R350 Frequency of micturition: Secondary | ICD-10-CM

## 2015-03-05 DIAGNOSIS — L298 Other pruritus: Secondary | ICD-10-CM | POA: Diagnosis not present

## 2015-03-05 DIAGNOSIS — E119 Type 2 diabetes mellitus without complications: Secondary | ICD-10-CM | POA: Diagnosis not present

## 2015-03-05 DIAGNOSIS — N898 Other specified noninflammatory disorders of vagina: Secondary | ICD-10-CM | POA: Diagnosis not present

## 2015-03-05 DIAGNOSIS — D72829 Elevated white blood cell count, unspecified: Secondary | ICD-10-CM

## 2015-03-05 DIAGNOSIS — L293 Anogenital pruritus, unspecified: Secondary | ICD-10-CM | POA: Diagnosis not present

## 2015-03-05 LAB — WET PREP, GENITAL
Bacteria: NONE SEEN — AB
CLUE CELLS WET PREP: NONE SEEN — AB
Trich, Wet Prep: NONE SEEN — AB
YEAST WET PREP: NONE SEEN — AB

## 2015-03-05 LAB — POCT URINALYSIS DIPSTICK
BILIRUBIN UA: NEGATIVE
Blood, UA: NEGATIVE
Leukocytes, UA: NEGATIVE
NITRITE UA: NEGATIVE
Protein, UA: NEGATIVE
Spec Grav, UA: 1.015
UROBILINOGEN UA: NEGATIVE
pH, UA: 6

## 2015-03-05 LAB — RPR

## 2015-03-05 LAB — HEMOGLOBIN A1C: Hgb A1c MFr Bld: 15.1 % — ABNORMAL HIGH (ref 4.6–6.5)

## 2015-03-05 MED ORDER — GLUCOSE BLOOD VI STRP: ORAL_STRIP | Status: DC

## 2015-03-05 MED ORDER — LISINOPRIL 20 MG PO TABS: 20.0000 mg | ORAL_TABLET | Freq: Every morning | ORAL | Status: DC

## 2015-03-05 MED ORDER — METFORMIN HCL 500 MG PO TABS: ORAL_TABLET | ORAL | Status: DC

## 2015-03-05 MED ORDER — FLUCONAZOLE 150 MG PO TABS: 150.0000 mg | ORAL_TABLET | Freq: Once | ORAL | Status: DC

## 2015-03-05 NOTE — Progress Notes (Addendum)
Subjective:    Patient ID: Stacie Adams, female    DOB: 03-May-1961, 54 y.o.   MRN: [id number]  Chief Complaint  Patient presents with  . Vaginal Itching    states that she has been itching and using the restroom alot, no other sxs associated, is sexually active and is not certain if it could be STD    HPI:  Stacie Adams is a 54 y.o. female who  has a past medical history of Diabetes mellitus without complication; Bipolar disorder; and Hypertension. and presents today for an acute office visit.   1.) Vaginal itching - This is a new problem. Associated symptoms of urinary frequency and vaginal itching has been going on for about 1 week. Describes a white vaginal discharge and itchiness. Denies any modifying factors that make it better or worse. Reports current sexual activity. Denies any fevers, chills or abdominal pain.  2.) Type 2 diabetes - Currently maintained on metformin. She has been out of her medication for 1 week indicating the pharmacy noted that she has to be seen. Takes the medication as prescribed when she has it and denies adverse side effects. Pneumovax, foot exam and eye exam are up to date.  Lab Results  Component Value Date   HGBA1C 15.1* 03/05/2015     No Known Allergies  Outpatient Prescriptions Prior to Visit  Medication Sig Dispense Refill  . buPROPion (WELLBUTRIN XL) 300 MG 24 hr tablet Take 1 tablet (300 mg total) by mouth at bedtime. 90 tablet 1  . Cholecalciferol (VITAMIN D3) 5000 UNITS CAPS Take 1 capsule by mouth daily.    . divalproex (DEPAKOTE) 500 MG DR tablet Take 1 tablet (500 mg total) by mouth 2 (two) times daily. 180 tablet 1  . docusate sodium (STOOL SOFTENER) 100 MG capsule Take 100 mg by mouth daily.    . haloperidol (HALDOL) 5 MG tablet Take 5 mg by mouth at bedtime.    . ciprofloxacin (CIPRO) 250 MG tablet Take 1 tablet (250 mg total) by mouth 2 (two) times daily. 6 tablet 0  . glucose blood (ONETOUCH VERIO) test strip Use Blood  Sugar Test Strips to Test blood sugar at least twice daily. Dx: E11.9 100 each 2  . insulin aspart protamine - aspart (NOVOLOG 70/30 MIX) (70-30) 100 UNIT/ML FlexPen Inject 0.25 mLs (25 Units total) into the skin 2 (two) times daily. 15 mL 11  . Insulin Pen Needle (INSUPEN PEN NEEDLES) 32G X 4 MM MISC Use 1 needle with each insulin inject 2 times daily. 100 each 2  . lisinopril (PRINIVIL,ZESTRIL) 20 MG tablet Take 1 tablet (20 mg total) by mouth every morning. 90 tablet 1  . metFORMIN (GLUCOPHAGE) 500 MG tablet TAKE 1 TABLET (500 MG TOTAL) BY MOUTH 2 (TWO) TIMES DAILY WITH A MEAL. 180 tablet 3  . rosuvastatin (CRESTOR) 10 MG tablet Take 1 tablet (10 mg total) by mouth at bedtime. 90 tablet 1   No facility-administered medications prior to visit.    Past Medical History  Diagnosis Date  . Diabetes mellitus without complication   . Bipolar disorder   . Hypertension      Review of Systems  Constitutional: Negative for fever and chills.  Genitourinary: Positive for frequency and vaginal discharge. Negative for dysuria, urgency, hematuria and vaginal pain.      Objective:    BP 158/98 mmHg  Pulse 104  Temp(Src) 98.4 F (36.9 C) (Oral)  Resp 18  Ht  (1.626 m)  Wt  161 lb 1.9 oz (73.084 kg)  BMI 27.64 kg/m2  SpO2 98% Nursing note and vital signs reviewed.  Physical Exam  Constitutional: She is oriented to person, place, and time. She appears well-developed and well-nourished. No distress.  Cardiovascular: Normal rate, regular rhythm, normal heart sounds and intact distal pulses.   Pulmonary/Chest: Effort normal and breath sounds normal.  Genitourinary: Vaginal discharge found.  White discharge present on bilateral labia with no tenderness. There is also mild redness consistent with itching.  Neurological: She is alert and oriented to person, place, and time.  Skin: Skin is warm and dry.  Psychiatric: She has a normal mood and affect. Her behavior is normal. Judgment and  thought content normal.       Assessment & Plan:   Problem List Items Addressed This Visit      Endocrine   Diabetes mellitus without complication    Previously taking Novolog 70/30 and metformin. Indicates she is no longer taking the insulin and ran out of the metformin about 1 week ago. Obtain A1c. All other diabetes prevention exams are up to date.       Relevant Medications   metFORMIN (GLUCOPHAGE) 500 MG tablet   lisinopril (PRINIVIL,ZESTRIL) 20 MG tablet   glucose blood (ONETOUCH VERIO) test strip   Other Relevant Orders   HgB A1c (Completed)     Other   Vaginal discharge - Primary    Discharge consistent potential yeast infection. Start Diflucan. Obtain wet prep. Obtain RPR, HSV 1/2, HIV and Hep C to rule out other causes. Follow up if symptoms worsen or fail to improve pending lab work.       Relevant Medications   fluconazole (DIFLUCAN) 150 MG tablet   Other Relevant Orders   Wet prep, genital (Completed)   HSV 1 antibody, IgG   HSV 2 antibody, IgG   RPR   Hepatitis C antibody   HIV antibody    Other Visit Diagnoses    Urinary frequency        Relevant Orders    POCT urinalysis dipstick (Completed)    Urine culture    Vaginal itching

## 2015-03-05 NOTE — Assessment & Plan Note (Signed)
Discharge consistent potential yeast infection. Start Diflucan. Obtain wet prep. Obtain RPR, HSV 1/2, HIV and Hep C to rule out other causes. Follow up if symptoms worsen or fail to improve pending lab work.

## 2015-03-05 NOTE — Telephone Encounter (Signed)
LVM for daughter to call back as soon as possible.

## 2015-03-05 NOTE — Progress Notes (Signed)
Pre visit review using our clinic review tool, if applicable. No additional management support is needed unless otherwise documented below in the visit note. 

## 2015-03-05 NOTE — Patient Instructions (Signed)
Thank you for choosing [facility].  Summary/Instructions:  Your prescription(s) have been submitted to your pharmacy or been printed and provided for you. Please take as directed and contact our office if you believe you are having problem(s) with the medication(s) or have any questions.  Please stop by the lab on the basement level of the building for your blood work. Your results will be released to MyChart (or called to you) after review, usually within 72 hours after test completion. If any changes need to be made, you will be notified at that same time.  If your symptoms worsen or fail to improve, please contact our office for further instruction, or in case of emergency go directly to the emergency room at the closest medical facility.   Candida Infection A Candida infection (also called yeast, fungus, and Monilia infection) is an overgrowth of yeast that can occur anywhere on the body. A yeast infection commonly occurs in warm, moist body areas. Usually, the infection remains localized but can spread to become a systemic infection. A yeast infection may be a sign of a more severe disease such as diabetes, leukemia, or AIDS. A yeast infection can occur in both men and women. In women, Candida vaginitis is a vaginal infection. It is one of the most common causes of vaginitis. Men usually do not have symptoms or know they have an infection until other problems develop. Men may find out they have a yeast infection because their sex partner has a yeast infection. Uncircumcised men are more likely to get a yeast infection than circumcised men. This is because the uncircumcised glans is not exposed to air and does not remain as dry as that of a circumcised glans. Older adults may develop yeast infections around dentures. CAUSES  Women  Antibiotics.  Steroid medication taken for a long time.  Being overweight (obese).  Diabetes.  Poor immune condition.  Certain serious medical  conditions.  Immune suppressive medications for organ transplant patients.  Chemotherapy.  Pregnancy.  Menstruation.  Stress and fatigue.  Intravenous drug use.  Oral contraceptives.  Wearing tight-fitting clothes in the crotch area.  Catching it from a sex partner who has a yeast infection.  Spermicide.  Intravenous, urinary, or other catheters. Men  Catching it from a sex partner who has a yeast infection.  Having oral or anal sex with a person who has the infection.  Spermicide.  Diabetes.  Antibiotics.  Poor immune system.  Medications that suppress the immune system.  Intravenous drug use.  Intravenous, urinary, or other catheters. SYMPTOMS  Women  Thick, white vaginal discharge.  Vaginal itching.  Redness and swelling in and around the vagina.  Irritation of the lips of the vagina and perineum.  Blisters on the vaginal lips and perineum.  Painful sexual intercourse.  Low blood sugar (hypoglycemia).  Painful urination.  Bladder infections.  Intestinal problems such as constipation, indigestion, bad breath, bloating, increase in gas, diarrhea, or loose stools. Men  Men may develop intestinal problems such as constipation, indigestion, bad breath, bloating, increase in gas, diarrhea, or loose stools.  Dry, cracked skin on the penis with itching or discomfort.  Jock itch.  Dry, flaky skin.  Athlete's foot.  Hypoglycemia. DIAGNOSIS  Women  A history and an exam are performed.  The discharge may be examined under a microscope.  A culture may be taken of the discharge. Men  A history and an exam are performed.  Any discharge from the penis or areas of cracked skin  will be looked at under the microscope and cultured.  Stool samples may be cultured. TREATMENT  Women  Vaginal antifungal suppositories and creams.  Medicated creams to decrease irritation and itching on the outside of the vagina.  Warm compresses to the  perineal area to decrease swelling and discomfort.  Oral antifungal medications.  Medicated vaginal suppositories or cream for repeated or recurrent infections.  Wash and dry the irritation areas before applying the cream.  Eating yogurt with Lactobacillus may help with prevention and treatment.  Sometimes painting the vagina with gentian violet solution may help if creams and suppositories do not work. Men  Antifungal creams and oral antifungal medications.  Sometimes treatment must continue for 30 days after the symptoms go away to prevent recurrence. HOME CARE INSTRUCTIONS  Women  Use cotton underwear and avoid tight-fitting clothing.  Avoid colored, scented toilet paper and deodorant tampons or pads.  Do not douche.  Keep your diabetes under control.  Finish all the prescribed medications.  Keep your skin clean and dry.  Consume milk or yogurt with Lactobacillus-active culture regularly. If you get frequent yeast infections and think that is what the infection is, there are over-the-counter medications that you can get. If the infection does not show healing in 3 days, talk to your caregiver.  Tell your sex partner you have a yeast infection. Your partner may need treatment also, especially if your infection does not clear up or recurs. Men  Keep your skin clean and dry.  Keep your diabetes under control.  Finish all prescribed medications.  Tell your sex partner that you have a yeast infection so he or she can be treated if necessary. SEEK MEDICAL CARE IF:   Your symptoms do not clear up or worsen in one week after treatment.  You have an oral temperature above 102 F (38.9 C).  You have trouble swallowing or eating for a prolonged time.  You develop blisters on and around your vagina.  You develop vaginal bleeding and it is not your menstrual period.  You develop abdominal pain.  You develop intestinal problems as mentioned above.  You get weak or  light-headed.  You have painful or increased urination.  You have pain during sexual intercourse. MAKE SURE YOU:   Understand these instructions.  Will watch your condition.  Will get help right away if you are not doing well or get worse. Document Released: 07/03/2004 Document Revised: 10/10/2013 Document Reviewed: 10/15/2009 [facility] Patient Information 2015 [place], [place]. This information is not intended to replace advice given to you by your health care provider. Make sure you discuss any questions you have with your health care provider.

## 2015-03-05 NOTE — Telephone Encounter (Signed)
Please call patient/daughter and inform her that her A1c is 15.1 which is uncontrolled and she needs to restart the insulin therapy. I would like her to restart taking the insulin 70/30 25 units in the morning and 25 units in the evening on top of the metformin.  Please check blood sugars twice daily and I would like to have her send her logs in about 1 month. We are still awaiting the results of her other testing.

## 2015-03-05 NOTE — Assessment & Plan Note (Signed)
Previously taking Novolog 70/30 and metformin. Indicates she is no longer taking the insulin and ran out of the metformin about 1 week ago. Obtain A1c. All other diabetes prevention exams are up to date.

## 2015-03-06 ENCOUNTER — Telehealth: Payer: Self-pay | Admitting: Family

## 2015-03-06 LAB — HSV 2 ANTIBODY, IGG: HSV 2 Glycoprotein G Ab, IgG: <0.1 IV

## 2015-03-06 LAB — HIV ANTIBODY (ROUTINE TESTING W REFLEX): HIV 1&2 Ab, 4th Generation: NONREACTIVE

## 2015-03-06 LAB — HEPATITIS C ANTIBODY: HCV Ab: NEGATIVE

## 2015-03-06 LAB — HSV 1 ANTIBODY, IGG: HSV 1 Glycoprotein G Ab, IgG: 6.64 IV — ABNORMAL HIGH

## 2015-03-06 LAB — URINE CULTURE: Colony Count: 30000

## 2015-03-06 NOTE — Telephone Encounter (Signed)
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 Otherwise, her other STD blood work was negative. If her symptoms are not resolved, I'd recommend follow up with gynecology.

## 2015-03-16 NOTE — Telephone Encounter (Signed)
Results have been sent in the mail 

## 2015-05-17 ENCOUNTER — Other Ambulatory Visit: Payer: Self-pay | Admitting: Family

## 2015-05-17 DIAGNOSIS — F250 Schizoaffective disorder, bipolar type: Secondary | ICD-10-CM | POA: Diagnosis not present

## 2015-05-18 DIAGNOSIS — H401131 Primary open-angle glaucoma, bilateral, mild stage: Secondary | ICD-10-CM | POA: Diagnosis not present

## 2015-05-18 NOTE — Telephone Encounter (Signed)
You have never filled this medication for pt

## 2015-05-24 ENCOUNTER — Other Ambulatory Visit (INDEPENDENT_AMBULATORY_CARE_PROVIDER_SITE_OTHER): Payer: Medicare Other

## 2015-05-24 ENCOUNTER — Ambulatory Visit (INDEPENDENT_AMBULATORY_CARE_PROVIDER_SITE_OTHER): Payer: Medicare Other | Admitting: Family

## 2015-05-24 ENCOUNTER — Encounter: Payer: Self-pay | Admitting: Family

## 2015-05-24 ENCOUNTER — Telehealth: Payer: Self-pay | Admitting: Family

## 2015-05-24 VITALS — BP 140/82 | HR 115 | Temp 98.9°F | Resp 18 | Ht 64.0 in | Wt 158.8 lb

## 2015-05-24 DIAGNOSIS — Z1231 Encounter for screening mammogram for malignant neoplasm of breast: Secondary | ICD-10-CM | POA: Diagnosis not present

## 2015-05-24 DIAGNOSIS — E119 Type 2 diabetes mellitus without complications: Secondary | ICD-10-CM

## 2015-05-24 DIAGNOSIS — Z23 Encounter for immunization: Secondary | ICD-10-CM

## 2015-05-24 DIAGNOSIS — M542 Cervicalgia: Secondary | ICD-10-CM

## 2015-05-24 DIAGNOSIS — Z1211 Encounter for screening for malignant neoplasm of colon: Secondary | ICD-10-CM

## 2015-05-24 LAB — HEMOGLOBIN A1C: HEMOGLOBIN A1C: 12.4 % — AB (ref 4.6–6.5)

## 2015-05-24 MED ORDER — INSULIN ASPART PROT & ASPART (70-30 MIX) 100 UNIT/ML PEN: 20.0000 [IU] | PEN_INJECTOR | Freq: Two times a day (BID) | SUBCUTANEOUS | Status: DC

## 2015-05-24 MED ORDER — GLUCOSE BLOOD VI STRP: ORAL_STRIP | Status: DC

## 2015-05-24 MED ORDER — NAPROXEN 500 MG PO TABS
500.0000 mg | ORAL_TABLET | Freq: Two times a day (BID) | ORAL | Status: DC
Start: 2015-05-24 — End: 2015-06-19

## 2015-05-24 MED ORDER — INSULIN PEN NEEDLE 29G X 12.7MM MISC: Status: DC

## 2015-05-24 MED ORDER — CYCLOBENZAPRINE HCL 10 MG PO TABS: 5.0000 mg | ORAL_TABLET | Freq: Three times a day (TID) | ORAL | Status: DC | PRN

## 2015-05-24 NOTE — Progress Notes (Signed)
Subjective:    Patient ID: Stacie Adams, female    DOB: 1961-01-19, 54 y.o.   MRN: [id number]  Chief Complaint  Patient presents with  . Back Pain    has pain in the upper part of her back, since last monday, pain is worse when sitting up, wants to set up mammo and colonoscopy    HPI:  Stacie Adams is a 54 y.o. female who  has a past medical history of Diabetes mellitus without complication (HCC); Bipolar disorder (HCC); and Hypertension. and presents today for an a follow up office visit.  A medical interpreter is present for today's visit.   1.) Back pain - This is a new problem. Associated symptoms of pain located in her upper back has been going on for about 10 days. Pain is primarily located behind her neck and occurs when she sits up. Pain is described as sharp. Denies any modifying factors that make it better or worse. Denies any trauma to the area. There is occasional numbness and tingling located in her left hand every once in a while.  2.) Prevention - Due for a mammorgram and colonoscopy.  3.) Diabetes - Uncontrolled with current regimen and A1c of 15.1. Takes the metformin as prescribed. Currently maintained on lisinopril for CAD risk reduction. Foot exam and urine microalbumin are up to date. Requests restart of insulin if necessary and refill of strips.   No Known Allergies   Current Outpatient Prescriptions on File Prior to Visit  Medication Sig Dispense Refill  . buPROPion (WELLBUTRIN XL) 300 MG 24 hr tablet Take 1 tablet (300 mg total) by mouth at bedtime. 90 tablet 1  . Cholecalciferol (VITAMIN D3) 5000 UNITS CAPS Take 1 capsule by mouth daily.    . divalproex (DEPAKOTE) 500 MG DR tablet Take 1 tablet (500 mg total) by mouth 2 (two) times daily. 180 tablet 1  . docusate sodium (STOOL SOFTENER) 100 MG capsule Take 100 mg by mouth daily.    . fluconazole (DIFLUCAN) 150 MG tablet Take 1 tablet (150 mg total) by mouth once. 1 tablet 0  . haloperidol (HALDOL) 5 MG  tablet Take 5 mg by mouth at bedtime.    Marland Kitchen lisinopril (PRINIVIL,ZESTRIL) 20 MG tablet Take 1 tablet (20 mg total) by mouth every morning. 90 tablet 1  . metFORMIN (GLUCOPHAGE) 500 MG tablet TAKE 1 TABLET (500 MG TOTAL) BY MOUTH 2 (TWO) TIMES DAILY WITH A MEAL. 180 tablet 3  . rosuvastatin (CRESTOR) 10 MG tablet TAKE 1 TABLET (10 MG TOTAL) BY MOUTH AT BEDTIME. 90 tablet 1   No current facility-administered medications on file prior to visit.    Past Medical History  Diagnosis Date  . Diabetes mellitus without complication (HCC)   . Bipolar disorder (HCC)   . Hypertension      Review of Systems  Constitutional: Negative for fever and chills.  Musculoskeletal: Positive for back pain (upper back ).  Neurological: Positive for numbness. Negative for weakness.      Objective:    BP 140/82 mmHg  Pulse 115  Temp(Src) 98.9 F (37.2 C) (Oral)  Resp 18  Ht  (1.626 m)  Wt 158 lb 12.8 oz (72.031 kg)  BMI 27.24 kg/m2  SpO2 99% Nursing note and vital signs reviewed.  Physical Exam  Constitutional: She is oriented to person, place, and time. She appears well-developed and well-nourished. No distress.  Neck:  No obvious deformity, discoloration, or edema present. Tenderness elicited on left paraspinal musculature  with mild muscle spasm present. Range of motion is restricted and lateral bending secondary to muscle tightness. All other motions are within normal limits. Compression and Spurling's tests are negative. Distal pulses and sensation are intact and appropriate.  Cardiovascular: Normal rate, regular rhythm, normal heart sounds and intact distal pulses.   Pulmonary/Chest: Effort normal and breath sounds normal.  Neurological: She is alert and oriented to person, place, and time.  Skin: Skin is warm and dry.  Psychiatric: She has a normal mood and affect. Her behavior is normal. Judgment and thought content normal.       Assessment & Plan:   Problem List Items Addressed This  Visit      Endocrine   Diabetes mellitus without complication (HCC)    Type 2 diabetes uncontrolled with current regimen of metformin. Continue current dosage of metformin and start insulin 70/30 flex pen at 20 units twice daily. Continue to monitor blood sugars at home. Obtain A1c. Follow-up in 3 months for new A1c or sooner if needed uncontrolled blood sugars.      Relevant Medications   glucose blood (ONETOUCH VERIO) test strip   insulin aspart protamine - aspart (NOVOLOG MIX 70/30 FLEXPEN) (70-30) 100 UNIT/ML FlexPen   Other Relevant Orders   Hemoglobin A1c     Other   Neck pain    Neck pain consistent with cervical neck muscle spasm/strain. Start naproxen and cyclobenzaprine as needed for discomfort and spasm. Start home exercise therapy and heat multiple times throughout the day. Follow-up if symptoms worsen or fail to improve.      Relevant Medications   cyclobenzaprine (FLEXERIL) 10 MG tablet   naproxen (NAPROSYN) 500 MG tablet    Other Visit Diagnoses    Encounter for immunization    -  Primary    Encounter for screening mammogram for breast cancer        Relevant Orders    MM DIGITAL SCREENING BILATERAL    Special screening for malignant neoplasms, colon        Relevant Orders    Ambulatory referral to Gastroenterology

## 2015-05-24 NOTE — Patient Instructions (Addendum)
Thank you for choosing [facility].  Summary/Instructions:  Your prescription(s) have been submitted to your pharmacy or been printed and provided for you. Please take as directed and contact our office if you believe you are having problem(s) with the medication(s) or have any questions.  Please stop by the lab on the basement level of the building for your blood work. Your results will be released to MyChart (or called to you) after review, usually within 72 hours after test completion. If any changes need to be made, you will be notified at that same time.  Please stop by radiology on the basement level of the building for your x-rays. Your results will be released to MyChart (or called to you) after review, usually within 72 hours after test completion. If any treatments or changes are necessary, you will be notified at that same time.  Referrals have been made during this visit. You should expect to hear back from our schedulers in about 7-10 days in regards to establishing an appointment with the specialists we discussed.   If your symptoms worsen or fail to improve, please contact our office for further instruction, or in case of emergency go directly to the emergency room at the closest medical facility.    Cervical Strain and Sprain With Rehab Cervical strain and sprain are injuries that commonly occur with "whiplash" injuries. Whiplash occurs when the neck is forcefully whipped backward or forward, such as during a motor vehicle accident or during contact sports. The muscles, ligaments, tendons, discs, and nerves of the neck are susceptible to injury when this occurs. RISK FACTORS Risk of having a whiplash injury increases if:  Osteoarthritis of the spine.  Situations that make head or neck accidents or trauma more likely.  High-risk sports (football, rugby, wrestling, hockey, auto racing, gymnastics, diving, contact karate, or boxing).  Poor strength and flexibility of the  neck.  Previous neck injury.  Poor tackling technique.  Improperly fitted or padded equipment. SYMPTOMS   Pain or stiffness in the front or back of neck or both.  Symptoms may present immediately or up to 24 hours after injury.  Dizziness, headache, nausea, and vomiting.  Muscle spasm with soreness and stiffness in the neck.  Tenderness and swelling at the injury site. PREVENTION  Learn and use proper technique (avoid tackling with the head, spearing, and head-butting; use proper falling techniques to avoid landing on the head).  Warm up and stretch properly before activity.  Maintain physical fitness:  Strength, flexibility, and endurance.  Cardiovascular fitness.  Wear properly fitted and padded protective equipment, such as padded soft collars, for participation in contact sports. PROGNOSIS  Recovery from cervical strain and sprain injuries is dependent on the extent of the injury. These injuries are usually curable in 1 week to 3 months with appropriate treatment.  RELATED COMPLICATIONS   Temporary numbness and weakness may occur if the nerve roots are damaged, and this may persist until the nerve has completely healed.  Chronic pain due to frequent recurrence of symptoms.  Prolonged healing, especially if activity is resumed too soon (before complete recovery). TREATMENT  Treatment initially involves the use of ice and medication to help reduce pain and inflammation. It is also important to perform strengthening and stretching exercises and modify activities that worsen symptoms so the injury does not get worse. These exercises may be performed at home or with a therapist. For patients who experience severe symptoms, a soft, padded collar may be recommended to be worn around  the neck.  Improving your posture may help reduce symptoms. Posture improvement includes pulling your chin and abdomen in while sitting or standing. If you are sitting, sit in a firm chair with your  buttocks against the back of the chair. While sleeping, try replacing your pillow with a small towel rolled to 2 inches in diameter, or use a cervical pillow or soft cervical collar. Poor sleeping positions delay healing.  For patients with nerve root damage, which causes numbness or weakness, the use of a cervical traction apparatus may be recommended. Surgery is rarely necessary for these injuries. However, cervical strain and sprains that are present at birth (congenital) may require surgery. MEDICATION   If pain medication is necessary, nonsteroidal anti-inflammatory medications, such as aspirin and ibuprofen, or other minor pain relievers, such as acetaminophen, are often recommended.  Do not take pain medication for 7 days before surgery.  Prescription pain relievers may be given if deemed necessary by your caregiver. Use only as directed and only as much as you need. HEAT AND COLD:   Cold treatment (icing) relieves pain and reduces inflammation. Cold treatment should be applied for 10 to 15 minutes every 2 to 3 hours for inflammation and pain and immediately after any activity that aggravates your symptoms. Use ice packs or an ice massage.  Heat treatment may be used prior to performing the stretching and strengthening activities prescribed by your caregiver, physical therapist, or athletic trainer. Use a heat pack or a warm soak. SEEK MEDICAL CARE IF:   Symptoms get worse or do not improve in 2 weeks despite treatment.  New, unexplained symptoms develop (drugs used in treatment may produce side effects). EXERCISES RANGE OF MOTION (ROM) AND STRETCHING EXERCISES - Cervical Strain and Sprain These exercises may help you when beginning to rehabilitate your injury. In order to successfully resolve your symptoms, you must improve your posture. These exercises are designed to help reduce the forward-head and rounded-shoulder posture which contributes to this condition. Your symptoms may resolve  with or without further involvement from your physician, physical therapist or athletic trainer. While completing these exercises, remember:   Restoring tissue flexibility helps normal motion to return to the joints. This allows healthier, less painful movement and activity.  An effective stretch should be held for at least 20 seconds, although you may need to begin with shorter hold times for comfort.  A stretch should never be painful. You should only feel a gentle lengthening or release in the stretched tissue. STRETCH- Axial Extensors  Lie on your back on the floor. You may bend your knees for comfort. Place a rolled-up hand towel or dish towel, about 2 inches in diameter, under the part of your head that makes contact with the floor.  Gently tuck your chin, as if trying to make a "double chin," until you feel a gentle stretch at the base of your head.  Hold __________ seconds. Repeat __________ times. Complete this exercise __________ times per day.  STRETCH - Axial Extension   Stand or sit on a firm surface. Assume a good posture: chest up, shoulders drawn back, abdominal muscles slightly tense, knees unlocked (if standing) and feet hip width apart.  Slowly retract your chin so your head slides back and your chin slightly lowers. Continue to look straight ahead.  You should feel a gentle stretch in the back of your head. Be certain not to feel an aggressive stretch since this can cause headaches later.  Hold for __________ seconds. Repeat __________  times. Complete this exercise __________ times per day. STRETCH - Cervical Side Bend   Stand or sit on a firm surface. Assume a good posture: chest up, shoulders drawn back, abdominal muscles slightly tense, knees unlocked (if standing) and feet hip width apart.  Without letting your nose or shoulders move, slowly tip your right / left ear to your shoulder until your feel a gentle stretch in the muscles on the opposite side of your  neck.  Hold __________ seconds. Repeat __________ times. Complete this exercise __________ times per day. STRETCH - Cervical Rotators   Stand or sit on a firm surface. Assume a good posture: chest up, shoulders drawn back, abdominal muscles slightly tense, knees unlocked (if standing) and feet hip width apart.  Keeping your eyes level with the ground, slowly turn your head until you feel a gentle stretch along the back and opposite side of your neck.  Hold __________ seconds. Repeat __________ times. Complete this exercise __________ times per day. RANGE OF MOTION - Neck Circles   Stand or sit on a firm surface. Assume a good posture: chest up, shoulders drawn back, abdominal muscles slightly tense, knees unlocked (if standing) and feet hip width apart.  Gently roll your head down and around from the back of one shoulder to the back of the other. The motion should never be forced or painful.  Repeat the motion 10-20 times, or until you feel the neck muscles relax and loosen. Repeat __________ times. Complete the exercise __________ times per day. STRENGTHENING EXERCISES - Cervical Strain and Sprain These exercises may help you when beginning to rehabilitate your injury. They may resolve your symptoms with or without further involvement from your physician, physical therapist, or athletic trainer. While completing these exercises, remember:   Muscles can gain both the endurance and the strength needed for everyday activities through controlled exercises.  Complete these exercises as instructed by your physician, physical therapist, or athletic trainer. Progress the resistance and repetitions only as guided.  You may experience muscle soreness or fatigue, but the pain or discomfort you are trying to eliminate should never worsen during these exercises. If this pain does worsen, stop and make certain you are following the directions exactly. If the pain is still present after adjustments,  discontinue the exercise until you can discuss the trouble with your clinician. STRENGTH - Cervical Flexors, Isometric  Face a wall, standing about 6 inches away. Place a small pillow, a ball about 6-8 inches in diameter, or a folded towel between your forehead and the wall.  Slightly tuck your chin and gently push your forehead into the soft object. Push only with mild to moderate intensity, building up tension gradually. Keep your jaw and forehead relaxed.  Hold 10 to 20 seconds. Keep your breathing relaxed.  Release the tension slowly. Relax your neck muscles completely before you start the next repetition. Repeat __________ times. Complete this exercise __________ times per day. STRENGTH- Cervical Lateral Flexors, Isometric   Stand about 6 inches away from a wall. Place a small pillow, a ball about 6-8 inches in diameter, or a folded towel between the side of your head and the wall.  Slightly tuck your chin and gently tilt your head into the soft object. Push only with mild to moderate intensity, building up tension gradually. Keep your jaw and forehead relaxed.  Hold 10 to 20 seconds. Keep your breathing relaxed.  Release the tension slowly. Relax your neck muscles completely before you start the next repetition. Repeat  __________ times. Complete this exercise __________ times per day. STRENGTH - Cervical Extensors, Isometric   Stand about 6 inches away from a wall. Place a small pillow, a ball about 6-8 inches in diameter, or a folded towel between the back of your head and the wall.  Slightly tuck your chin and gently tilt your head back into the soft object. Push only with mild to moderate intensity, building up tension gradually. Keep your jaw and forehead relaxed.  Hold 10 to 20 seconds. Keep your breathing relaxed.  Release the tension slowly. Relax your neck muscles completely before you start the next repetition. Repeat __________ times. Complete this exercise __________  times per day. POSTURE AND BODY MECHANICS CONSIDERATIONS - Cervical Strain and Sprain Keeping correct posture when sitting, standing or completing your activities will reduce the stress put on different body tissues, allowing injured tissues a chance to heal and limiting painful experiences. The following are general guidelines for improved posture. Your physician or physical therapist will provide you with any instructions specific to your needs. While reading these guidelines, remember:  The exercises prescribed by your provider will help you have the flexibility and strength to maintain correct postures.  The correct posture provides the optimal environment for your joints to work. All of your joints have less wear and tear when properly supported by a spine with good posture. This means you will experience a healthier, less painful body.  Correct posture must be practiced with all of your activities, especially prolonged sitting and standing. Correct posture is as important when doing repetitive low-stress activities (typing) as it is when doing a single heavy-load activity (lifting). PROLONGED STANDING WHILE SLIGHTLY LEANING FORWARD When completing a task that requires you to lean forward while standing in one place for a long time, place either foot up on a stationary 2- to 4-inch high object to help maintain the best posture. When both feet are on the ground, the low back tends to lose its slight inward curve. If this curve flattens (or becomes too large), then the back and your other joints will experience too much stress, fatigue more quickly, and can cause pain.  RESTING POSITIONS Consider which positions are most painful for you when choosing a resting position. If you have pain with flexion-based activities (sitting, bending, stooping, squatting), choose a position that allows you to rest in a less flexed posture. You would want to avoid curling into a fetal position on your side. If your  pain worsens with extension-based activities (prolonged standing, working overhead), avoid resting in an extended position such as sleeping on your stomach. Most people will find more comfort when they rest with their spine in a more neutral position, neither too rounded nor too arched. Lying on a non-sagging bed on your side with a pillow between your knees, or on your back with a pillow under your knees will often provide some relief. Keep in mind, being in any one position for a prolonged period of time, no matter how correct your posture, can still lead to stiffness. WALKING Walk with an upright posture. Your ears, shoulders, and hips should all line up. OFFICE WORK When working at a desk, create an environment that supports good, upright posture. Without extra support, muscles fatigue and lead to excessive strain on joints and other tissues. CHAIR:  A chair should be able to slide under your desk when your back makes contact with the back of the chair. This allows you to work closely.  The chair's  height should allow your eyes to be level with the upper part of your monitor and your hands to be slightly lower than your elbows.  Body position:  Your feet should make contact with the floor. If this is not possible, use a foot rest.  Keep your ears over your shoulders. This will reduce stress on your neck and low back.   This information is not intended to replace advice given to you by your health care provider. Make sure you discuss any questions you have with your health care provider.   Document Released: 05/26/2005 Document Revised: 06/16/2014 Document Reviewed: 09/07/2008 Elsevier Interactive Patient Education [employer]

## 2015-05-24 NOTE — Telephone Encounter (Signed)
Please inform patient that her A1c is down to 12.1 and is still in need of starting the insulin. Please have her follow up in 3 months for A1c.

## 2015-05-24 NOTE — Progress Notes (Signed)
Pre visit review using our clinic review tool, if applicable. No additional management support is needed unless otherwise documented below in the visit note. 

## 2015-05-24 NOTE — Assessment & Plan Note (Signed)
Neck pain consistent with cervical neck muscle spasm/strain. Start naproxen and cyclobenzaprine as needed for discomfort and spasm. Start home exercise therapy and heat multiple times throughout the day. Follow-up if symptoms worsen or fail to improve.

## 2015-05-24 NOTE — Assessment & Plan Note (Signed)
Type 2 diabetes uncontrolled with current regimen of metformin. Continue current dosage of metformin and start insulin 70/30 flex pen at 20 units twice daily. Continue to monitor blood sugars at home. Obtain A1c. Follow-up in 3 months for new A1c or sooner if needed uncontrolled blood sugars.

## 2015-05-29 NOTE — Telephone Encounter (Signed)
LVM for daughter to call back.  

## 2015-05-30 NOTE — Telephone Encounter (Signed)
Results being sent in the mail.  

## 2015-06-19 ENCOUNTER — Other Ambulatory Visit: Payer: Self-pay | Admitting: Family

## 2015-06-22 ENCOUNTER — Encounter (HOSPITAL_COMMUNITY): Payer: Self-pay | Admitting: Emergency Medicine

## 2015-06-22 ENCOUNTER — Other Ambulatory Visit: Payer: Self-pay | Admitting: Family

## 2015-06-22 ENCOUNTER — Emergency Department (HOSPITAL_COMMUNITY)
Admission: EM | Admit: 2015-06-22 | Discharge: 2015-06-22 | Disposition: A | Discharge status: Home / Self Care | Payer: Medicare Other | Attending: Emergency Medicine | Admitting: Emergency Medicine

## 2015-06-22 DIAGNOSIS — Z7984 Long term (current) use of oral hypoglycemic drugs: Secondary | ICD-10-CM | POA: Diagnosis not present

## 2015-06-22 DIAGNOSIS — F319 Bipolar disorder, unspecified: Secondary | ICD-10-CM | POA: Diagnosis not present

## 2015-06-22 DIAGNOSIS — I10 Essential (primary) hypertension: Secondary | ICD-10-CM | POA: Diagnosis not present

## 2015-06-22 DIAGNOSIS — F4320 Adjustment disorder, unspecified: Secondary | ICD-10-CM | POA: Diagnosis not present

## 2015-06-22 DIAGNOSIS — E119 Type 2 diabetes mellitus without complications: Secondary | ICD-10-CM | POA: Insufficient documentation

## 2015-06-22 DIAGNOSIS — Z79899 Other long term (current) drug therapy: Secondary | ICD-10-CM | POA: Diagnosis not present

## 2015-06-22 DIAGNOSIS — Z87891 Personal history of nicotine dependence: Secondary | ICD-10-CM | POA: Diagnosis not present

## 2015-06-22 DIAGNOSIS — Z794 Long term (current) use of insulin: Secondary | ICD-10-CM | POA: Diagnosis not present

## 2015-06-22 DIAGNOSIS — F4321 Adjustment disorder with depressed mood: Secondary | ICD-10-CM

## 2015-06-22 DIAGNOSIS — Z791 Long term (current) use of non-steroidal anti-inflammatories (NSAID): Secondary | ICD-10-CM | POA: Diagnosis not present

## 2015-06-22 DIAGNOSIS — F419 Anxiety disorder, unspecified: Secondary | ICD-10-CM | POA: Diagnosis not present

## 2015-06-22 MED ORDER — LORAZEPAM 1 MG PO TABS: 1.0000 mg | ORAL_TABLET | Freq: Three times a day (TID) | ORAL | Status: DC | PRN

## 2015-06-22 MED ORDER — LORAZEPAM 1 MG PO TABS
2.0000 mg | ORAL_TABLET | Freq: Once | ORAL | Status: DC
  Filled 2015-06-22: qty 2

## 2015-06-22 MED ORDER — LORAZEPAM 1 MG PO TABS
1.0000 mg | ORAL_TABLET | Freq: Once | ORAL | Status: AC
  Administered 2015-06-22: 1 mg via ORAL

## 2015-06-22 NOTE — ED Provider Notes (Signed)
CSN: [id number]     Arrival date & time 06/22/15  1330 History   First MD Initiated Contact with Patient 06/22/15 1456     Chief Complaint  Patient presents with  . 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 Patient is a 55 y.o. female presenting with anxiety. The history is provided by the patient.  Anxiety This is a recurrent problem. Pertinent negatives include no chest pain, no abdominal pain, no headaches and no shortness of breath.   patient presents with anxiety. History of same and bipolar disorder. She is on medications and has a therapist. She has follow-up. She comes in with the social worker and one of her aide workers. She is recently had a death of her father and brother. She has not been doing as well with it. She's been anxious. No suicidal thoughts. No substance abuse. [facility] She has not seen them recently. She does have a good support system in a short-term follow-up. Has occasional delusions but denies hallucinations. No chest pain or trouble breathing. No headache. No trauma.   Past Medical History  Diagnosis Date  . Diabetes mellitus without complication (HCC)   . Bipolar disorder (HCC)   . Hypertension    History reviewed. No pertinent past surgical history. Family History  Problem Relation Age of Onset  . Hypertension Mother   . Diabetes Mother   . Hypertension Father   . Diabetes Father   . Cancer Sister   . Cancer Brother   . Diabetes Other    Social History  Substance Use Topics  . Smoking status: Former Smoker -- 1.00 packs/day for 2 years    Quit date: 07/05/2004  . Smokeless tobacco: Never Used  . Alcohol Use: No     Comment: rare   OB History    No data available     Review of Systems  Constitutional: Negative for activity change and appetite change.  Eyes: Negative for pain.  Respiratory: Negative for chest tightness and shortness of breath.   Cardiovascular: Negative for chest pain  and leg swelling.  Gastrointestinal: Negative for nausea, vomiting, abdominal pain and diarrhea.  Genitourinary: Negative for flank pain.  Musculoskeletal: Negative for back pain and neck stiffness.  Skin: Negative for rash.  Neurological: Negative for weakness, numbness and headaches.  Psychiatric/Behavioral: Positive for dysphoric mood. Negative for behavioral problems. The patient is nervous/anxious.       Allergies  Review of patient's allergies indicates no known allergies.  Home Medications   Prior to Admission medications   Medication Sig Start Date End Date Taking? Authorizing Provider  buPROPion (WELLBUTRIN XL) 300 MG 24 hr tablet Take 1 tablet (300 mg total) by mouth at bedtime. 10/04/14  Yes Veryl SpeakGregory D Calone, FNP  Cholecalciferol (VITAMIN D3) 5000 UNITS CAPS Take 1 capsule by mouth daily.   Yes Historical Provider, MD  cyclobenzaprine (FLEXERIL) 10 MG tablet Take 0.5-1 tablets (5-10 mg total) by mouth 3 (three) times daily as needed for muscle spasms. 05/24/15  Yes Veryl SpeakGregory D Calone, FNP  divalproex (DEPAKOTE) 500 MG DR tablet Take 1 tablet (500 mg total) by mouth 2 (two) times daily. 10/04/14  Yes Veryl SpeakGregory D Calone, FNP  glucose blood (ONETOUCH VERIO) test strip Use Blood Sugar Test Strips to Test blood sugar at least twice daily. Dx: E11.9 05/24/15  Yes Veryl SpeakGregory D Calone, FNP  haloperidol (HALDOL) 5 MG tablet Take 5 mg by mouth at bedtime.  Yes Historical Provider, MD  insulin aspart protamine - aspart (NOVOLOG MIX 70/30 FLEXPEN) (70-30) 100 UNIT/ML FlexPen Inject 0.2 mLs (20 Units total) into the skin 2 (two) times daily. 05/24/15  Yes Veryl Speak, FNP  Insulin Pen Needle (BD ULTRA-FINE PEN NEEDLES) 29G X 12.7MM MISC Use 1 needle to inject insulin 2 times daily as instructed. Use a new needle for each injection. 05/24/15  Yes Veryl Speak, FNP  latanoprost (XALATAN) 0.005 % ophthalmic solution Place 1 drop into both eyes at bedtime. 04/16/15  Yes Historical Provider, MD   lisinopril (PRINIVIL,ZESTRIL) 20 MG tablet Take 1 tablet (20 mg total) by mouth every morning. 03/05/15  Yes Veryl Speak, FNP  metFORMIN (GLUCOPHAGE) 500 MG tablet TAKE 1 TABLET (500 MG TOTAL) BY MOUTH 2 (TWO) TIMES DAILY WITH A MEAL. 03/05/15  Yes Veryl Speak, FNP  naproxen (NAPROSYN) 500 MG tablet TAKE 1 TABLET (500 MG TOTAL) BY MOUTH 2 (TWO) TIMES DAILY WITH A MEAL. 06/19/15  Yes Veryl Speak, FNP  rosuvastatin (CRESTOR) 10 MG tablet TAKE 1 TABLET (10 MG TOTAL) BY MOUTH AT BEDTIME. 03/05/15  Yes Veryl Speak, FNP  fluconazole (DIFLUCAN) 150 MG tablet Take 1 tablet (150 mg total) by mouth once. Patient not taking: Reported on 06/22/2015 03/05/15   Veryl Speak, FNP  LORazepam (ATIVAN) 1 MG tablet Take 1 tablet (1 mg total) by mouth 3 (three) times daily as needed for anxiety. 06/22/15   Benjiman Core, MD   BP 174/96 mmHg  Pulse 110  Temp(Src) 98 F (36.7 C) (Oral)  Resp 18  SpO2 100% Physical Exam  Constitutional: She appears well-developed.  HENT:  Head: Atraumatic.  Eyes: EOM are normal.  Neck: Neck supple.  Cardiovascular: Normal rate.   Pulmonary/Chest: Effort normal.  Abdominal: Soft.  Musculoskeletal: Normal range of motion.  Neurological: She is alert.  Skin: Skin is warm.  Psychiatric: Her behavior is normal. Thought content normal.    ED Course  Procedures (including critical care time) Labs Review Labs Reviewed - No data to display  Imaging Review No results found. I have personally reviewed and evaluated these images and lab results as part of my medical decision-making.   EKG Interpretation None      MDM   Final diagnoses:  Anxiety  Grief reaction    Patient presents with some depression and anxiety. History of bipolar disorder. She has good follow-up. Discussed with her aide. She was given a short course of Ativan. She does not appear to be a risk for self and appears stable follow-up as an outpatient. Discuss with Child psychotherapist and  her aide. Has been seen at [facility] and has a separate therapist. Will discharge home.    Benjiman Core, MD 06/22/15 1537

## 2015-06-22 NOTE — Discharge Instructions (Signed)
Complicated Grieving Grief is a normal response to the death of someone close to you. Feelings of fear, anger, and guilt can affect almost everyone who loses a loved one. It is also common to have symptoms of depression while you are grieving. These include problems with sleep, loss of appetite, and lack of energy. They may last for weeks or months after a loss. Complicated grief is different from normal grief or depression. Normal grieving involves sadness and feelings of loss, but these feelings are not constant. Complicated grief is a constant and severe type of grief. It interferes with your ability to function normally. It may last for several months to a year or longer. Complicated grief may require treatment from a mental health care provider. CAUSES  It is not known why some people continue to struggle with grief and others do not. You may be at higher risk for complicated grief if:  The death of your loved one was sudden or unexpected.  The death of your loved one was due to a violent event.  Your loved one committed suicide.  Your loved one was a child or a young person.  You were very close to or dependent on the loved one.  You have a history of depression. SIGNS AND SYMPTOMS Signs and symptoms of complicated grief may include:  Feeling disbelief or numbness.  Being unable to enjoy good memories of your loved one.  Needing to avoid anything that reminds you of your loved one.  Being unable to stop thinking about the death.  Feeling intense anger or guilt.  Feeling alone and hopeless.  Feeling that your life is meaningless and empty.  Losing the desire to live. DIAGNOSIS Your health care provider may diagnose complicated grief if:  You have constant symptoms of grief for 6-12 months or longer.  Your symptoms are interfering with your ability to live your life. Your health care provider may want you to see a mental health care provider. Many symptoms of depression  are similar to the symptoms of complicated grief. It is important to be evaluated for complicated grief along with other mental health conditions. TREATMENT  Talk therapy with a mental health provider is the most common treatment for complicated grief. During therapy, you will learn healthy ways to cope with the loss of your loved one. In some cases, your mental health care provider may also recommend antidepressant medicines. HOME CARE INSTRUCTIONS  Take care of yourself.  Eat regular meals and maintain a healthy diet. Eat plenty of fruits, vegetables, and whole grains.  Try to get some exercise each day.  Keep regular hours for sleep. Try to get at least 8 hours of sleep each night.  Do not use drugs or alcohol to ease your symptoms.  Take medicines only as directed by your health care provider.  Spend time with friends and loved ones.  Consider joining a grief (bereavement) support group to help you deal with your loss.  Keep all follow-up visits as directed by your health care provider. This is important. SEEK MEDICAL CARE IF:  Your symptoms keep you from functioning normally.  Your symptoms do not get better with treatment. SEEK IMMEDIATE MEDICAL CARE IF:  You have serious thoughts of hurting yourself or someone else.  You have suicidal feelings.   This information is not intended to replace advice given to you by your health care provider. Make sure you discuss any questions you have with your health care provider.   Document Released: 05/26/2005  Document Revised: 02/14/2015 Document Reviewed: 11/03/2013 Elsevier Interactive Patient Education 2016 [employer]  Panic Attacks Panic attacks are sudden, short-livedsurges of severe anxiety, fear, or discomfort. They may occur for no reason when you are relaxed, when you are anxious, or when you are sleeping. Panic attacks may occur for a number of reasons:   Healthy people occasionally have panic attacks in extreme,  life-threatening situations, such as war or natural disasters. Normal anxiety is a protective mechanism of the body that helps us react to danger (fight or flight response).  Panic attacks are often seen with anxiety disorders, such as panic disorder, social anxiety disorder, generalized anxiety disorder, and phobias. Anxiety disorders cause excessive or uncontrollable anxiety. They may interfere with your relationships or other life activities.  Panic attacks are sometimes seen with other mental illnesses, such as depression and posttraumatic stress disorder.  Certain medical conditions, prescription medicines, and drugs of abuse can cause panic attacks. SYMPTOMS  Panic attacks start suddenly, peak within 20 minutes, and are accompanied by four or more of the following symptoms:  Pounding heart or fast heart rate (palpitations).  Sweating.  Trembling or shaking.  Shortness of breath or feeling smothered.  Feeling choked.  Chest pain or discomfort.  Nausea or strange feeling in your stomach.  Dizziness, light-headedness, or feeling like you will faint.  Chills or hot flushes.  Numbness or tingling in your lips or hands and feet.  Feeling that things are not real or feeling that you are not yourself.  Fear of losing control or going crazy.  Fear of dying. Some of these symptoms can mimic serious medical conditions. For example, you may think you are having a heart attack. Although panic attacks can be very scary, they are not life threatening. DIAGNOSIS  Panic attacks are diagnosed through an assessment by your health care provider. Your health care provider will ask questions about your symptoms, such as where and when they occurred. Your health care provider will also ask about your medical history and use of alcohol and drugs, including prescription medicines. Your health care provider may order blood tests or other studies to rule out a serious medical condition. Your health  care provider may refer you to a mental health professional for further evaluation. TREATMENT   Most healthy people who have one or two panic attacks in an extreme, life-threatening situation will not require treatment.  The treatment for panic attacks associated with anxiety disorders or other mental illness typically involves counseling with a mental health professional, medicine, or a combination of both. Your health care provider will help determine what treatment is best for you.  Panic attacks due to physical illness usually go away with treatment of the illness. If prescription medicine is causing panic attacks, talk with your health care provider about stopping the medicine, decreasing the dose, or substituting another medicine.  Panic attacks due to alcohol or drug abuse go away with abstinence. Some adults need professional help in order to stop drinking or using drugs. HOME CARE INSTRUCTIONS   Take all medicines as directed by your health care provider.   Schedule and attend follow-up visits as directed by your health care provider. It is important to keep all your appointments. SEEK MEDICAL CARE IF:  You are not able to take your medicines as prescribed.  Your symptoms do not improve or get worse. SEEK IMMEDIATE MEDICAL CARE IF:   You experience panic attack symptoms that are different than your usual symptoms.  You have serious  thoughts about hurting yourself or others.  You are taking medicine for panic attacks and have a serious side effect. MAKE SURE YOU:  Understand these instructions.  Will watch your condition.  Will get help right away if you are not doing well or get worse.   This information is not intended to replace advice given to you by your health care provider. Make sure you discuss any questions you have with your health care provider.   Document Released: 05/26/2005 Document Revised: 05/31/2013 Document Reviewed: 01/07/2013 Elsevier Interactive  Patient Education [employer]

## 2015-06-22 NOTE — ED Notes (Signed)
Patient is deaf, states she has had two family members die recently-increased anxiety, can't sleep

## 2015-06-26 ENCOUNTER — Telehealth: Payer: Self-pay | Admitting: Family

## 2015-06-26 DIAGNOSIS — E119 Type 2 diabetes mellitus without complications: Secondary | ICD-10-CM

## 2015-06-26 NOTE — Telephone Encounter (Signed)
Is requesting on touch test strips to be sent to [facility] at [facility] [address]

## 2015-06-27 ENCOUNTER — Encounter (HOSPITAL_COMMUNITY): Payer: Self-pay | Admitting: Emergency Medicine

## 2015-06-27 ENCOUNTER — Emergency Department (HOSPITAL_COMMUNITY)
Admission: EM | Admit: 2015-06-27 | Discharge: 2015-06-27 | Disposition: A | Discharge status: Home / Self Care | Payer: Medicare Other | Attending: Emergency Medicine | Admitting: Emergency Medicine

## 2015-06-27 DIAGNOSIS — Z87891 Personal history of nicotine dependence: Secondary | ICD-10-CM | POA: Diagnosis not present

## 2015-06-27 DIAGNOSIS — Y998 Other external cause status: Secondary | ICD-10-CM | POA: Insufficient documentation

## 2015-06-27 DIAGNOSIS — Z23 Encounter for immunization: Secondary | ICD-10-CM | POA: Diagnosis not present

## 2015-06-27 DIAGNOSIS — Y9389 Activity, other specified: Secondary | ICD-10-CM | POA: Diagnosis not present

## 2015-06-27 DIAGNOSIS — W260XXA Contact with knife, initial encounter: Secondary | ICD-10-CM | POA: Insufficient documentation

## 2015-06-27 DIAGNOSIS — E119 Type 2 diabetes mellitus without complications: Secondary | ICD-10-CM | POA: Diagnosis not present

## 2015-06-27 DIAGNOSIS — I10 Essential (primary) hypertension: Secondary | ICD-10-CM | POA: Diagnosis not present

## 2015-06-27 DIAGNOSIS — F319 Bipolar disorder, unspecified: Secondary | ICD-10-CM | POA: Insufficient documentation

## 2015-06-27 DIAGNOSIS — Z79899 Other long term (current) drug therapy: Secondary | ICD-10-CM | POA: Diagnosis not present

## 2015-06-27 DIAGNOSIS — Y9289 Other specified places as the place of occurrence of the external cause: Secondary | ICD-10-CM | POA: Diagnosis not present

## 2015-06-27 DIAGNOSIS — S61411A Laceration without foreign body of right hand, initial encounter: Secondary | ICD-10-CM | POA: Insufficient documentation

## 2015-06-27 DIAGNOSIS — Z794 Long term (current) use of insulin: Secondary | ICD-10-CM | POA: Diagnosis not present

## 2015-06-27 DIAGNOSIS — Z7984 Long term (current) use of oral hypoglycemic drugs: Secondary | ICD-10-CM | POA: Diagnosis not present

## 2015-06-27 DIAGNOSIS — Z791 Long term (current) use of non-steroidal anti-inflammatories (NSAID): Secondary | ICD-10-CM | POA: Insufficient documentation

## 2015-06-27 DIAGNOSIS — S61401A Unspecified open wound of right hand, initial encounter: Secondary | ICD-10-CM | POA: Diagnosis not present

## 2015-06-27 DIAGNOSIS — S6991XA Unspecified injury of right wrist, hand and finger(s), initial encounter: Secondary | ICD-10-CM | POA: Diagnosis present

## 2015-06-27 DIAGNOSIS — T148 Other injury of unspecified body region: Secondary | ICD-10-CM | POA: Diagnosis not present

## 2015-06-27 MED ORDER — LIDOCAINE HCL (PF) 1 % IJ SOLN
20.0000 mL | Freq: Once | INTRAMUSCULAR | Status: AC
  Administered 2015-06-27: 20 mL
  Filled 2015-06-27: qty 20

## 2015-06-27 MED ORDER — TETANUS-DIPHTH-ACELL PERTUSSIS 5-2.5-18.5 LF-MCG/0.5 IM SUSP
0.5000 mL | Freq: Once | INTRAMUSCULAR | Status: AC
  Administered 2015-06-27: 0.5 mL via INTRAMUSCULAR
  Filled 2015-06-27: qty 0.5

## 2015-06-27 NOTE — ED Provider Notes (Signed)
CSN: [id number]     Arrival date & time 06/27/15  1956 History   First MD Initiated Contact with Patient 06/27/15 2137     Chief Complaint  Patient presents with  . Extremity Laceration    bleeding controled     Aurilla MARLEN KOMAN is a 55 y.o. female who is deaf who presents to the emergency department complaining of a laceration to her right hand. Family is at bedside who can translate for her. She reports she was washing dishes when she accidentally sliced her right hand with a kitchen knife just prior to arrival. She denies self harm. She is unsure when her last Tdap was. She denies numbness, tingling, or weakness.   (Consider location/radiation/quality/duration/timing/severity/associated sxs/prior Treatment) HPI  Past Medical History  Diagnosis Date  . Diabetes mellitus without complication (HCC)   . Bipolar disorder (HCC)   . Hypertension    History reviewed. No pertinent past surgical history. Family History  Problem Relation Age of Onset  . Hypertension Mother   . Diabetes Mother   . Hypertension Father   . Diabetes Father   . Cancer Sister   . Cancer Brother   . Diabetes Other    Social History  Substance Use Topics  . Smoking status: Former Smoker -- 1.00 packs/day for 2 years    Quit date: 07/05/2004  . Smokeless tobacco: Never Used  . Alcohol Use: No     Comment: rare   OB History    No data available     Review of Systems  Constitutional: Negative for fever.  Skin: Positive for wound. Negative for rash.  Neurological: Negative for weakness and numbness.  Psychiatric/Behavioral: Negative for suicidal ideas and self-injury.      Allergies  Review of patient's allergies indicates no known allergies.  Home Medications   Prior to Admission medications   Medication Sig Start Date End Date Taking? Authorizing Provider  buPROPion (WELLBUTRIN XL) 300 MG 24 hr tablet Take 1 tablet (300 mg total) by mouth at bedtime. 10/04/14  Yes Veryl Speak, FNP   Cholecalciferol (VITAMIN D3) 5000 UNITS CAPS Take 1 capsule by mouth daily.   Yes Historical Provider, MD  cyclobenzaprine (FLEXERIL) 10 MG tablet Take 0.5-1 tablets (5-10 mg total) by mouth 3 (three) times daily as needed for muscle spasms. 05/24/15  Yes Veryl Speak, FNP  divalproex (DEPAKOTE) 500 MG DR tablet Take 1 tablet (500 mg total) by mouth 2 (two) times daily. 10/04/14  Yes Veryl Speak, FNP  haloperidol (HALDOL) 5 MG tablet Take 5 mg by mouth at bedtime.   Yes Historical Provider, MD  insulin aspart protamine - aspart (NOVOLOG MIX 70/30 FLEXPEN) (70-30) 100 UNIT/ML FlexPen Inject 0.2 mLs (20 Units total) into the skin 2 (two) times daily. 05/24/15  Yes Veryl Speak, FNP  latanoprost (XALATAN) 0.005 % ophthalmic solution Place 1 drop into both eyes at bedtime. 04/16/15  Yes Historical Provider, MD  lisinopril (PRINIVIL,ZESTRIL) 20 MG tablet Take 1 tablet (20 mg total) by mouth every morning. 03/05/15  Yes Veryl Speak, FNP  LORazepam (ATIVAN) 1 MG tablet Take 1 tablet (1 mg total) by mouth 3 (three) times daily as needed for anxiety. 06/22/15  Yes Benjiman Core, MD  metFORMIN (GLUCOPHAGE) 500 MG tablet TAKE 1 TABLET (500 MG TOTAL) BY MOUTH 2 (TWO) TIMES DAILY WITH A MEAL. 03/05/15  Yes Veryl Speak, FNP  fluconazole (DIFLUCAN) 150 MG tablet Take 1 tablet (150 mg total) by mouth once. Patient not taking:  Reported on 06/22/2015 03/05/15   Veryl Speak, FNP  glucose blood (ONETOUCH VERIO) test strip Use Blood Sugar Test Strips to Test blood sugar at least twice daily. Dx: E11.9 05/24/15   Veryl Speak, FNP  Insulin Pen Needle (BD ULTRA-FINE PEN NEEDLES) 29G X 12.7MM MISC Use 1 needle to inject insulin 2 times daily as instructed. Use a new needle for each injection. 05/24/15   Veryl Speak, FNP  naproxen (NAPROSYN) 500 MG tablet TAKE 1 TABLET (500 MG TOTAL) BY MOUTH 2 (TWO) TIMES DAILY WITH A MEAL. 06/19/15   Veryl Speak, FNP  rosuvastatin (CRESTOR) 10 MG tablet  TAKE 1 TABLET (10 MG TOTAL) BY MOUTH AT BEDTIME. Patient not taking: Reported on 06/27/2015 03/05/15   Veryl Speak, FNP   BP 164/100 mmHg  Pulse 112  Temp(Src) 97.8 F (36.6 C) (Oral)  Resp 16  SpO2 94% Physical Exam  Constitutional: She appears well-developed and well-nourished. No distress.  HENT:  Head: Normocephalic and atraumatic.  Eyes: Right eye exhibits no discharge. Left eye exhibits no discharge.  Cardiovascular: Normal rate, regular rhythm and intact distal pulses.   Bilateral radial pulses are intact. Good capillary refill to her right distal fingertips.  Pulmonary/Chest: Effort normal. No respiratory distress.  Musculoskeletal:  Patient has good right hand grip strength. Patient has good strength with all of her distal right fingers.  Neurological: She is alert. Coordination normal.  No numbness or weakness surrounding or distal to the wound.   Skin: Skin is warm and dry. No rash noted. She is not diaphoretic. No erythema. No pallor.  2.5 cm laceration to the lateral aspect of her right hand by her fifth finger. This is superficial. Bleeding is controlled. No evidence of tendon involvement. No foreign bodies noted.  Psychiatric: She has a normal mood and affect. Her behavior is normal.  Nursing note and vitals reviewed.   ED Course  .Marland KitchenLaceration Repair Date/Time: 06/27/2015 10:30 PM Performed by: Everlene Farrier Authorized by: Everlene Farrier Consent: Verbal consent obtained. Risks and benefits: risks, benefits and alternatives were discussed Consent given by: patient Patient understanding: patient states understanding of the procedure being performed Patient consent: the patient's understanding of the procedure matches consent given Procedure consent: procedure consent matches procedure scheduled Relevant documents: relevant documents present and verified Test results: test results available and properly labeled Site marked: the operative site was  marked Required items: required blood products, implants, devices, and special equipment available Patient identity confirmed: arm band Time out: Immediately prior to procedure a "time out" was called to verify the correct patient, procedure, equipment, support staff and site/side marked as required. Body area: upper extremity Location details: right hand Laceration length: 2.5 cm Foreign bodies: no foreign bodies Tendon involvement: none Nerve involvement: none Vascular damage: no Anesthesia: local infiltration Local anesthetic: lidocaine 1% without epinephrine Anesthetic total: 2 ml Patient sedated: no Preparation: Patient was prepped and draped in the usual sterile fashion. Irrigation solution: saline Irrigation method: jet lavage Amount of cleaning: extensive Debridement: none Degree of undermining: none Skin closure: 4-0 Prolene Number of sutures: 4 Technique: simple Approximation: close Approximation difficulty: simple Dressing: non-adhesive packing strip and 4x4 sterile gauze Patient tolerance: Patient tolerated the procedure well with no immediate complications   (including critical care time) Labs Review Labs Reviewed - No data to display  Imaging Review No results found.    EKG Interpretation None      Filed Vitals:   06/27/15 2002  BP: 164/100  Pulse:  112  Temp: 97.8 F (36.6 C)  TempSrc: Oral  Resp: 16  SpO2: 94%     MDM   Meds given in ED:  Medications  Tdap (BOOSTRIX) injection 0.5 mL (0.5 mLs Intramuscular Given 06/27/15 2159)  lidocaine (PF) (XYLOCAINE) 1 % injection 20 mL (20 mLs Infiltration Given 06/27/15 2159)    Discharge Medication List as of 06/27/2015 10:39 PM      Final diagnoses:  Hand laceration, right, initial encounter   This is a 55 year old female who presents with right hand laceration. She has a 2.5 cm laceration to the lateral aspect of her right hand. It is superficial. No evidence of tendon involvement. Patient's  tetanus is updated in the emergency department. Laceration was repaired by me with 4 sutures. Patient tolerated the procedure well. I advised follow-up in 7 days to have her sutures removed. Discussed wound care precautions. I advised the patient to follow-up with their primary care provider this week. I advised the patient to return to the emergency department with new or worsening symptoms or new concerns. The patient verbalized understanding and agreement with plan.         Everlene Farrier, PA-C 06/27/15 1610  Arby Barrette, MD 06/27/15 2351

## 2015-06-27 NOTE — ED Notes (Addendum)
Pt is deaf and does not read lips. Pt can communicate in writing. Pt has a laceration on her right hand from a kitchen knife. The laceration is about 1 inch in size and bleeding is controlled. Pt stated she is not sure when her last tetanus shot was

## 2015-06-27 NOTE — Discharge Instructions (Signed)

## 2015-06-28 MED ORDER — GLUCOSE BLOOD VI STRP: 1.0000 | ORAL_STRIP | Freq: Two times a day (BID) | Status: DC

## 2015-06-28 MED ORDER — GLUCOSE BLOOD VI STRP: ORAL_STRIP | Status: DC

## 2015-06-28 NOTE — Telephone Encounter (Signed)
[facility].../lmb 

## 2015-06-29 DIAGNOSIS — F250 Schizoaffective disorder, bipolar type: Secondary | ICD-10-CM | POA: Diagnosis not present

## 2015-07-06 ENCOUNTER — Emergency Department (HOSPITAL_COMMUNITY)
Admission: EM | Admit: 2015-07-06 | Discharge: 2015-07-08 | Disposition: A | Discharge status: Other Acute Inpatient Hospital | Payer: Medicare Other | Attending: Emergency Medicine | Admitting: Emergency Medicine

## 2015-07-06 ENCOUNTER — Encounter (HOSPITAL_COMMUNITY): Payer: Self-pay | Admitting: Emergency Medicine

## 2015-07-06 DIAGNOSIS — R45851 Suicidal ideations: Secondary | ICD-10-CM | POA: Diagnosis present

## 2015-07-06 DIAGNOSIS — Z79899 Other long term (current) drug therapy: Secondary | ICD-10-CM | POA: Diagnosis not present

## 2015-07-06 DIAGNOSIS — H9190 Unspecified hearing loss, unspecified ear: Secondary | ICD-10-CM | POA: Diagnosis not present

## 2015-07-06 DIAGNOSIS — E119 Type 2 diabetes mellitus without complications: Secondary | ICD-10-CM | POA: Diagnosis not present

## 2015-07-06 DIAGNOSIS — Z794 Long term (current) use of insulin: Secondary | ICD-10-CM | POA: Diagnosis not present

## 2015-07-06 DIAGNOSIS — Z7984 Long term (current) use of oral hypoglycemic drugs: Secondary | ICD-10-CM | POA: Diagnosis not present

## 2015-07-06 DIAGNOSIS — Z87891 Personal history of nicotine dependence: Secondary | ICD-10-CM | POA: Insufficient documentation

## 2015-07-06 DIAGNOSIS — I10 Essential (primary) hypertension: Secondary | ICD-10-CM | POA: Diagnosis not present

## 2015-07-06 DIAGNOSIS — Z791 Long term (current) use of non-steroidal anti-inflammatories (NSAID): Secondary | ICD-10-CM | POA: Diagnosis not present

## 2015-07-06 DIAGNOSIS — F315 Bipolar disorder, current episode depressed, severe, with psychotic features: Secondary | ICD-10-CM | POA: Diagnosis not present

## 2015-07-06 DIAGNOSIS — F3130 Bipolar disorder, current episode depressed, mild or moderate severity, unspecified: Secondary | ICD-10-CM | POA: Diagnosis present

## 2015-07-06 DIAGNOSIS — F3132 Bipolar disorder, current episode depressed, moderate: Secondary | ICD-10-CM | POA: Diagnosis not present

## 2015-07-06 LAB — CBC
HCT: 36.8 % (ref 36.0–46.0)
Hemoglobin: 11.6 g/dL — ABNORMAL LOW (ref 12.0–15.0)
MCH: 24.4 pg — ABNORMAL LOW (ref 26.0–34.0)
MCHC: 31.5 g/dL (ref 30.0–36.0)
MCV: 77.3 fL — ABNORMAL LOW (ref 78.0–100.0)
Platelets: 224 10*3/uL (ref 150–400)
RBC: 4.76 MIL/uL (ref 3.87–5.11)
RDW: 16 % — ABNORMAL HIGH (ref 11.5–15.5)
WBC: 9 10*3/uL (ref 4.0–10.5)

## 2015-07-06 LAB — RAPID URINE DRUG SCREEN, HOSP PERFORMED
Amphetamines: NOT DETECTED
Barbiturates: NOT DETECTED
Benzodiazepines: NOT DETECTED
Cocaine: NOT DETECTED
Opiates: NOT DETECTED
Tetrahydrocannabinol: NOT DETECTED

## 2015-07-06 LAB — CBG MONITORING, ED
Glucose-Capillary: 127 mg/dL — ABNORMAL HIGH (ref 65–99)
Glucose-Capillary: 168 mg/dL — ABNORMAL HIGH (ref 65–99)

## 2015-07-06 LAB — ETHANOL: Alcohol, Ethyl (B): <5 mg/dL (ref <5)

## 2015-07-06 LAB — COMPREHENSIVE METABOLIC PANEL
ALT: 5 U/L — ABNORMAL LOW (ref 14–54)
AST: 14 U/L — ABNORMAL LOW (ref 15–41)
Albumin: 4.4 g/dL (ref 3.5–5.0)
Alkaline Phosphatase: 42 U/L (ref 38–126)
Anion gap: 9 (ref 5–15)
BUN: 18 mg/dL (ref 6–20)
CO2: 26 mmol/L (ref 22–32)
Calcium: 9.2 mg/dL (ref 8.9–10.3)
Chloride: 104 mmol/L (ref 101–111)
Creatinine, Ser: 0.87 mg/dL (ref 0.44–1.00)
GFR calc Af Amer: >60 mL/min (ref >60)
GFR calc non Af Amer: >60 mL/min (ref >60)
Glucose, Bld: 177 mg/dL — ABNORMAL HIGH (ref 65–99)
Potassium: 4.6 mmol/L (ref 3.5–5.1)
Sodium: 139 mmol/L (ref 135–145)
Total Bilirubin: 0.5 mg/dL (ref 0.3–1.2)
Total Protein: 7.8 g/dL (ref 6.5–8.1)

## 2015-07-06 LAB — ACETAMINOPHEN LEVEL: Acetaminophen (Tylenol), Serum: <10 ug/mL — ABNORMAL LOW (ref 10–30)

## 2015-07-06 LAB — SALICYLATE LEVEL: Salicylate Lvl: <4 mg/dL (ref 2.8–30.0)

## 2015-07-06 MED ORDER — METFORMIN HCL 500 MG PO TABS
500.0000 mg | ORAL_TABLET | Freq: Two times a day (BID) | ORAL | Status: DC
  Administered 2015-07-06 – 2015-07-08 (×4): 500 mg via ORAL
  Filled 2015-07-06 (×6): qty 1

## 2015-07-06 MED ORDER — LISINOPRIL 20 MG PO TABS
20.0000 mg | ORAL_TABLET | Freq: Every morning | ORAL | Status: DC
  Administered 2015-07-07 – 2015-07-08 (×2): 20 mg via ORAL
  Filled 2015-07-06 (×2): qty 1

## 2015-07-06 MED ORDER — DIVALPROEX SODIUM 500 MG PO DR TAB
500.0000 mg | DELAYED_RELEASE_TABLET | Freq: Two times a day (BID) | ORAL | Status: DC
  Administered 2015-07-06 – 2015-07-07 (×2): 500 mg via ORAL
  Filled 2015-07-06 (×2): qty 1

## 2015-07-06 MED ORDER — BUPROPION HCL ER (XL) 300 MG PO TB24
300.0000 mg | ORAL_TABLET | Freq: Every day | ORAL | Status: DC
  Administered 2015-07-07: 300 mg via ORAL
  Filled 2015-07-06 (×2): qty 1

## 2015-07-06 MED ORDER — LORAZEPAM 1 MG PO TABS
1.0000 mg | ORAL_TABLET | Freq: Three times a day (TID) | ORAL | Status: DC | PRN
  Administered 2015-07-06: 1 mg via ORAL
  Filled 2015-07-06: qty 1

## 2015-07-06 MED ORDER — CYCLOBENZAPRINE HCL 10 MG PO TABS: 5.0000 mg | ORAL_TABLET | Freq: Three times a day (TID) | ORAL | Status: DC | PRN

## 2015-07-06 MED ORDER — INSULIN ASPART PROT & ASPART (70-30 MIX) 100 UNIT/ML PEN: 20.0000 [IU] | PEN_INJECTOR | Freq: Two times a day (BID) | SUBCUTANEOUS | Status: DC

## 2015-07-06 MED ORDER — INSULIN ASPART PROT & ASPART (70-30 MIX) 100 UNIT/ML ~~LOC~~ SUSP
20.0000 [IU] | Freq: Two times a day (BID) | SUBCUTANEOUS | Status: DC
  Administered 2015-07-06 – 2015-07-08 (×4): 20 [IU] via SUBCUTANEOUS
  Filled 2015-07-06: qty 10

## 2015-07-06 NOTE — Progress Notes (Signed)
Pt refused the pm dose of 70/30  Novolog of 20 units.  Pt stated through interpreter that she checks it at home before dose and would not take it for a glucose of 127.  I explained through sign lang interpreter that 70/30 is not sliding scale and actually would be covering her for the entire night.  Pt refused dose.  Will check CBG at HS and again throughout night if needed. Barnett Hatter P
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
## 2015-07-06 NOTE — Progress Notes (Signed)
EDCM consulted to assist with the cost of diabetic supplies.  Patient listed as having Medicare A and B.  EDCM spoke to patient at bedside.  EDCM explained unable to assist with the cost of lancets and strips as patient has insurance.  [facility] asked patient what the name of her glucose monitor is so [facility] could try to find patient assistance program for glucose supplies.  Patient reports her glucometer is called Alecia Lemming.  EDCM unable to find this brand, but did see One Touch Verio.  EDCM went back to speak to patient and asked if her glucometer is Verio?  Patient is not sure.  [facility] informed patient that Medicare part B covers her diabetic supplies, she is responsible for 20 percent payment or nothing at all.  EDCM encouraged patient to call her insurance company if her supplies are not being covered.  Patient thankful for services.  EDCM spoke to patient with use of hearing impaired interpreter at bedside.  No further EDCM needs at this time.
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
## 2015-07-06 NOTE — ED Notes (Signed)
Bed: ZO10 Expected date:  Expected time:  Means of arrival:  Comments: tr5

## 2015-07-06 NOTE — Progress Notes (Addendum)
Patient meets inpatient criteria, per Nanine Means, DNP.  Patient was referred to: [facility] - per [facility] - per intake, fax for the waitlist. [facility] - per Gatha Mayer, female adolescent and geri female and female beds only. Fax referral for waitlist. [facility] - per Dot Lanes, geriatric beds open. [facility], beds open on geriatric and psych-medical unit.  Melbourne Abts, LCSWA Disposition staff 07/06/2015 10:20 PM

## 2015-07-07 DIAGNOSIS — R45851 Suicidal ideations: Secondary | ICD-10-CM | POA: Diagnosis not present

## 2015-07-07 DIAGNOSIS — F3132 Bipolar disorder, current episode depressed, moderate: Secondary | ICD-10-CM | POA: Diagnosis not present

## 2015-07-07 DIAGNOSIS — F3130 Bipolar disorder, current episode depressed, mild or moderate severity, unspecified: Secondary | ICD-10-CM | POA: Diagnosis present

## 2015-07-07 DIAGNOSIS — F315 Bipolar disorder, current episode depressed, severe, with psychotic features: Secondary | ICD-10-CM | POA: Diagnosis not present

## 2015-07-07 LAB — CBG MONITORING, ED
Glucose-Capillary: 86 mg/dL (ref 65–99)
Glucose-Capillary: 127 mg/dL — ABNORMAL HIGH (ref 65–99)
Glucose-Capillary: 106 mg/dL — ABNORMAL HIGH (ref 65–99)

## 2015-07-07 MED ORDER — FLUOXETINE HCL 20 MG PO CAPS
20.0000 mg | ORAL_CAPSULE | Freq: Every day | ORAL | Status: DC
  Administered 2015-07-07 – 2015-07-08 (×2): 20 mg via ORAL
  Filled 2015-07-07 (×2): qty 1

## 2015-07-07 MED ORDER — ACETAMINOPHEN 500 MG PO TABS
1000.0000 mg | ORAL_TABLET | Freq: Four times a day (QID) | ORAL | Status: DC | PRN
  Administered 2015-07-07: 1000 mg via ORAL
  Filled 2015-07-07: qty 2

## 2015-07-07 MED ORDER — DIVALPROEX SODIUM 250 MG PO DR TAB
750.0000 mg | DELAYED_RELEASE_TABLET | Freq: Two times a day (BID) | ORAL | Status: DC
  Administered 2015-07-07 – 2015-07-08 (×2): 750 mg via ORAL
  Filled 2015-07-07 (×2): qty 1
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
## 2015-07-07 NOTE — ED Notes (Signed)
Awake. Verbally responsive. A/O x4. Resp even and unlabored. No audible adventitious breath sounds noted. ABC's intact. Pt given meal to eat. No s/s of hypo/hyperglycemia noted. Sitter at bedside.

## 2015-07-07 NOTE — ED Notes (Signed)
Resting quietly with eye closed. Easily arousable. Verbally responsive. Resp even and unlabored. ABC's intact. No behavior problems noted. NAD noted. Sitter at bedside.  

## 2015-07-07 NOTE — ED Notes (Signed)
Awake. Verbally responsive. A/O x4. Resp even and unlabored. No audible adventitious breath sounds noted. ABC's intact. Sitter at bedside. 

## 2015-07-07 NOTE — ED Notes (Signed)
Awake. Verbally responsive. A/O x4. Resp even and unlabored. No audible adventitious breath sounds noted. ABC's intact. Pt allowed to use cellphone to send messages to family members d/t unable to use phone. Pt explained the policy of allowing cell phone use for TCU pts. Pt eaten supper tray. Sitter at bedside.

## 2015-07-07 NOTE — ED Notes (Signed)
Resting quietly with eye closed. Easily arousable. Verbally responsive. Resp even and unlabored. ABC's intact. No behavior problems noted. Pt denies SI/HI and audilble/visual hallucinations. NAD noted. Sitter at bedside.  

## 2015-07-07 NOTE — Clinical Social Work Note (Signed)
CSW spoke with pt's daughter to obtain pt needs for discharge services.  CSW prompted pt's daughter to discuss pt history and current needs.  Per pt's daughter pt had been functioning pretty well until her father and brother both passed away within a month of each other last December.  Pt attends [facility] [employer] in the day and also has friends that are deaf and she goes out and does activities with.  Pt also attends the senior center for monthly events and has video calling set up at home to be able to talk with friends.  Pt's daughter discussed pt's desire for independence and she hoped that could occur in the future after pt is more stable with her medications.  Pt sees [facility] for her medications.  CSW referred pt and daughter to grief counseling through Hospice and arranged for a meeting tomorrow with pt and her daughter to discuss any other needs in or outside of the home.  CSW placed a call to interpretor services to obtain that service for the meeting tomorrow. CSW will call daughter back with a time once secured.   [facility] LCSW [place] [facility] [phone]

## 2015-07-07 NOTE — Consult Note (Addendum)
[place] [facility] Consult   Reason for Consult:  Depression, suicidal ideations Referring Physician:  EDP Patient Identification: Stacie Adams MRN:  [id number] Principal Diagnosis: bipolar affective disorder, current episode depressed  Diagnosis:   Patient Active Problem List   Diagnosis Date Noted  . Bipolar affective disorder, current episode depressed ([place]) [F31.30] 07/07/2015    Priority: High  . Neck pain [M54.2] 05/24/2015  . Vaginal discharge [N89.8] 03/05/2015  . Dysuria [R30.0] 10/04/2014  . Hyperlipidemia [E78.5] 07/05/2014  . CAP (community acquired pneumonia) [J18.9] 05/22/2014  . Hypoxia [R09.02] 05/22/2014  . Deaf [H91.90] 05/22/2014  . Diabetes mellitus without complication ([place]) [C78.9]   . Bipolar disorder ([place]) [F31.9]   . Hypertension [I10]     Total Time spent with patient: 45 minutes  Subjective:   Stacie Adams is a 55 y.o. female patient's medications changed and re-evaluate in am.  HPI:  On admission:  55 y.o. female who is deaf that presents to the emergency department with thoughts of self harm, but no plan. Patient requires an interpreter and states she was "thinking about cutting herself and doesn't want to go home because she just might do it." Patient states, "I probably would not want to do it, but I think about it a lot." She does have a laceration to her hand that she reports she cut when she was washing dishes and sliced her right hand with a kitchen knife just prior to arrival. Patient is currently being seen by [facility] where she receives medications for Bi-Polar and anxiety although she states she is currently compliant with her medications she could not recall what they were. She has had prior admission with [facility] with the last one being in 2011 where she presented with the same symptoms and followed up with [facility] where she continues to receives services. Patient states she has been having family problems with her daughter whom she  lives with and they "fight all the time." Patient was very anxious and agitated at the time of the assessment. 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 She is upset with her daughter that told her to "grow up" because she is doing everything and needs assistance from [place].  She had to have 4 stitches in her hand 3-4 weeks ago due to cutting herself on the lateral side of her hand when she was washing dishes, stitches removed yesterday.  Denies this was a suicide attempt.  Medications adjusted and referral to social worker made.  Patient reports she does not want to give up her $1700/mo to go to a group home or an assisted living.  Past Psychiatric History: depression, bipolar disorder  Risk to Self: Suicidal Ideation: Yes-Currently Present Suicidal Intent: No (Patient cannot contract for safety, wants to cut herself) Is patient at risk for suicide?: Yes Suicidal Plan?: Yes-Currently Present Specify Current Suicidal Plan: Patient states she might cut herself Access to Means: Yes Specify Access to Suicidal Means: Patient has knives at home What has been your use of drugs/alcohol within the last 12 months?: Patient has maintained sobriety for last six years How many times?: 1 Other Self Harm Risks:  None Triggers for Past Attempts: Family contact Intentional Self Injurious Behavior: None Risk to Others: Homicidal Ideation: No Thoughts of Harm to Others: No Current Homicidal Intent: No Current Homicidal Plan: No Access to Homicidal Means: No Identified Victim: na History of harm to others?: No Assessment of Violence:  None Noted Violent Behavior Description: na Does patient have access to weapons?: No Criminal Charges Pending?: No Does patient have a court date: No Prior Inpatient Therapy: Prior Inpatient Therapy: Yes Prior Therapy Dates: 2016 Prior Therapy Facilty/Provider(s): [facility] Reason for Treatment: Depression, SI Prior Outpatient Therapy: Prior Outpatient Therapy: Yes Prior Therapy Dates: 2016 Prior Therapy Facilty/Provider(s): [facility] Reason for Treatment: Bi-Polar Anxiety Does patient have an ACCT team?: No Does patient have Intensive In-House Services?  : No Does patient have [facility] services? : Yes Does patient have P4CC services?: No  Past Medical History:  Past Medical History  Diagnosis Date  . Diabetes mellitus without complication ([place])   . Bipolar disorder ([place])   . Hypertension    History reviewed. No pertinent past surgical history. Family History:  Family History  Problem Relation Age of Onset  . Hypertension Mother   . Diabetes Mother   . Hypertension Father   . Diabetes Father   . Cancer Sister   . Cancer Brother   . Diabetes Other    Family Psychiatric  History: None Social History:  History  Alcohol Use No    Comment: rare     History  Drug Use No    Social History   Social History  . Marital Status: Legally Separated    Spouse Name: N/A  . Number of Children: 2  . Years of Education: 14   Occupational History  . Disability    Social History Main Topics  . Smoking status: Former Smoker -- 1.00 packs/day for 2 years    Quit date: 07/05/2004  . Smokeless tobacco: Never Used  . Alcohol Use: No     Comment: rare  . Drug Use: No  . Sexual Activity: Not Asked   Other Topics Concern  . None   Social History Narrative   Born and raised in [place], [place]. Currently resides in a house with her daughter and grandson. 1 dog. Fun: Shopping, partying    Denies any religious beliefs that would effect health c   Additional Social History:    Pain  Medications: See MAR Prescriptions: See MAR Over the Counter: See MAR History of alcohol / drug use?: No history of alcohol / drug abuse (Patient has maintained sobriety for six years) Longest period of sobriety (when/how long): Current 6 years                      Allergies:  No Known Allergies  Labs:  Results for orders placed or performed during the hospital encounter of 07/06/15 (from the past 48 hour(s))  Comprehensive metabolic panel     Status: Abnormal   Collection Time: 07/06/15  4:28 PM  Result Value Ref Range   Sodium 139 135 - 145 mmol/L   Potassium 4.6 3.5 - 5.1 mmol/L   Chloride 104 101 - 111 mmol/L   CO2 26 22 - 32 mmol/L   Glucose, Bld 177 (H) 65 - 99 mg/dL   BUN 18 6 - 20 mg/dL   Creatinine, Ser 0.87 0.44 - 1.00 mg/dL   Calcium 9.2 8.9 - 10.3 mg/dL   Total Protein 7.8 6.5 - 8.1 g/dL   Albumin 4.4 3.5 - 5.0 g/dL  AST 14 (L) 15 - 41 U/L   ALT 5 (L) 14 - 54 U/L   Alkaline Phosphatase 42 38 - 126 U/L   Total Bilirubin 0.5 0.3 - 1.2 mg/dL   GFR calc non Af Amer >60 >60 mL/min   GFR calc Af Amer >60 >60 mL/min    Comment: (NOTE) The eGFR has been calculated using the CKD EPI equation. This calculation has not been validated in all clinical situations. eGFR's persistently <60 mL/min signify possible Chronic Kidney Disease.    Anion gap 9 5 - 15  Ethanol (ETOH)     Status: None   Collection Time: 07/06/15  4:28 PM  Result Value Ref Range   Alcohol, Ethyl (B) <5 <5 mg/dL    Comment:        LOWEST DETECTABLE LIMIT FOR SERUM ALCOHOL IS 5 mg/dL FOR MEDICAL PURPOSES ONLY   Salicylate level     Status: None   Collection Time: 07/06/15  4:28 PM  Result Value Ref Range   Salicylate Lvl <8.1 2.8 - 30.0 mg/dL  Acetaminophen level     Status: Abnormal   Collection Time: 07/06/15  4:28 PM  Result Value Ref Range   Acetaminophen (Tylenol), Serum <10 (L) 10 - 30 ug/mL    Comment:        THERAPEUTIC CONCENTRATIONS VARY SIGNIFICANTLY. A RANGE OF 10-30 ug/mL  MAY BE AN EFFECTIVE CONCENTRATION FOR MANY PATIENTS. HOWEVER, SOME ARE BEST TREATED AT CONCENTRATIONS OUTSIDE THIS RANGE. ACETAMINOPHEN CONCENTRATIONS >150 ug/mL AT 4 HOURS AFTER INGESTION AND >50 ug/mL AT 12 HOURS AFTER INGESTION ARE OFTEN ASSOCIATED WITH TOXIC REACTIONS.   CBC     Status: Abnormal   Collection Time: 07/06/15  4:28 PM  Result Value Ref Range   WBC 9.0 4.0 - 10.5 K/uL   RBC 4.76 3.87 - 5.11 MIL/uL   Hemoglobin 11.6 (L) 12.0 - 15.0 g/dL   HCT 36.8 36.0 - 46.0 %   MCV 77.3 (L) 78.0 - 100.0 fL   MCH 24.4 (L) 26.0 - 34.0 pg   MCHC 31.5 30.0 - 36.0 g/dL   RDW 16.0 (H) 11.5 - 15.5 %   Platelets 224 150 - 400 K/uL  Urine rapid drug screen (hosp performed) (Not at [facility])     Status: None   Collection Time: 07/06/15  4:31 PM  Result Value Ref Range   Opiates NONE DETECTED NONE DETECTED   Cocaine NONE DETECTED NONE DETECTED   Benzodiazepines NONE DETECTED NONE DETECTED   Amphetamines NONE DETECTED NONE DETECTED   Tetrahydrocannabinol NONE DETECTED NONE DETECTED   Barbiturates NONE DETECTED NONE DETECTED    Comment:        DRUG SCREEN FOR MEDICAL PURPOSES ONLY.  IF CONFIRMATION IS NEEDED FOR ANY PURPOSE, NOTIFY LAB WITHIN 5 DAYS.        LOWEST DETECTABLE LIMITS FOR URINE DRUG SCREEN Drug Class       Cutoff (ng/mL) Amphetamine      1000 Barbiturate      200 Benzodiazepine   191 Tricyclics       478 Opiates          300 Cocaine          300 THC              50   POC CBG, ED     Status: Abnormal   Collection Time: 07/06/15  7:18 PM  Result Value Ref Range   Glucose-Capillary 127 (H) 65 - 99 mg/dL  CBG monitoring, ED  Status: Abnormal   Collection Time: 07/06/15  9:25 PM  Result Value Ref Range   Glucose-Capillary 168 (H) 65 - 99 mg/dL  CBG monitoring, ED     Status: None   Collection Time: 07/07/15 11:57 AM  Result Value Ref Range   Glucose-Capillary 86 65 - 99 mg/dL   Comment 1 Notify RN    Comment 2 Document in Chart     Current  Facility-Administered Medications  Medication Dose Route Frequency Provider Last Rate Last Dose  . acetaminophen (TYLENOL) tablet 1,000 mg  1,000 mg Oral Q6H PRN Deno Etienne, DO   1,000 mg at 07/07/15 0914  . buPROPion (WELLBUTRIN XL) 24 hr tablet 300 mg  300 mg Oral QHS Virgel Manifold, MD      . cyclobenzaprine (FLEXERIL) tablet 5-10 mg  5-10 mg Oral TID PRN Virgel Manifold, MD      . divalproex (DEPAKOTE) DR tablet 750 mg  750 mg Oral BID Patrecia Pour, NP      . FLUoxetine (PROZAC) capsule 20 mg  20 mg Oral Daily Patrecia Pour, NP      . insulin aspart protamine- aspart (NOVOLOG MIX 70/30) injection 20 Units  20 Units Subcutaneous BID WC Virgel Manifold, MD   20 Units at 07/07/15 0914  . lisinopril (PRINIVIL,ZESTRIL) tablet 20 mg  20 mg Oral q morning - 10a Virgel Manifold, MD   20 mg at 07/07/15 0914  . metFORMIN (GLUCOPHAGE) tablet 500 mg  500 mg Oral BID WC Virgel Manifold, MD   500 mg at 07/07/15 7124   Current Outpatient Prescriptions  Medication Sig Dispense Refill  . buPROPion (WELLBUTRIN XL) 300 MG 24 hr tablet Take 1 tablet (300 mg total) by mouth at bedtime. 90 tablet 1  . Cholecalciferol (VITAMIN D3) 5000 UNITS CAPS Take 1 capsule by mouth daily.    . cyclobenzaprine (FLEXERIL) 10 MG tablet Take 0.5-1 tablets (5-10 mg total) by mouth 3 (three) times daily as needed for muscle spasms. 60 tablet 0  . divalproex (DEPAKOTE) 500 MG DR tablet Take 1 tablet (500 mg total) by mouth 2 (two) times daily. 180 tablet 1  . haloperidol (HALDOL) 5 MG tablet Take 5 mg by mouth.    . insulin aspart protamine - aspart (NOVOLOG MIX 70/30 FLEXPEN) (70-30) 100 UNIT/ML FlexPen Inject 0.2 mLs (20 Units total) into the skin 2 (two) times daily. 15 mL 11  . latanoprost (XALATAN) 0.005 % ophthalmic solution Place 1 drop into both eyes at bedtime.  6  . lisinopril (PRINIVIL,ZESTRIL) 20 MG tablet Take 1 tablet (20 mg total) by mouth every morning. 90 tablet 1  . LORazepam (ATIVAN) 1 MG tablet Take 1 tablet (1 mg  total) by mouth 3 (three) times daily as needed for anxiety. 6 tablet 0  . metFORMIN (GLUCOPHAGE) 500 MG tablet TAKE 1 TABLET (500 MG TOTAL) BY MOUTH 2 (TWO) TIMES DAILY WITH A MEAL. 180 tablet 3  . naproxen (NAPROSYN) 500 MG tablet TAKE 1 TABLET (500 MG TOTAL) BY MOUTH 2 (TWO) TIMES DAILY WITH A MEAL. 60 tablet 0  . fluconazole (DIFLUCAN) 150 MG tablet Take 1 tablet (150 mg total) by mouth once. (Patient not taking: Reported on 06/22/2015) 1 tablet 0  . glucose blood (ONE TOUCH ULTRA TEST) test strip 1 each by Other route 2 (two) times daily. Use to check blood sugars twice a day Dx E11.9 (Patient not taking: Reported on 07/06/2015) 100 each 3  . glucose blood (ONETOUCH VERIO) test strip Use Blood  Sugar Test Strips to Test blood sugar at least twice daily. Dx: E11.9 (Patient not taking: Reported on 07/06/2015) 100 each 3  . Insulin Pen Needle (BD ULTRA-FINE PEN NEEDLES) 29G X 12.7MM MISC Use 1 needle to inject insulin 2 times daily as instructed. Use a new needle for each injection. (Patient not taking: Reported on 07/06/2015) 100 each 5  . rosuvastatin (CRESTOR) 10 MG tablet TAKE 1 TABLET (10 MG TOTAL) BY MOUTH AT BEDTIME. (Patient not taking: Reported on 06/27/2015) 90 tablet 1    Musculoskeletal: Strength & Muscle Tone: within normal limits Gait & Station: normal Patient leans: N/A  Psychiatric Specialty Exam: Review of Systems  Constitutional: Negative.   HENT: Negative.   Eyes: Negative.   Respiratory: Negative.   Cardiovascular: Negative.   Gastrointestinal: Negative.   Genitourinary: Negative.   Musculoskeletal: Negative.   Skin: Negative.   Neurological: Negative.   Endo/Heme/Allergies: Negative.   Psychiatric/Behavioral: Positive for depression and suicidal ideas.    Blood pressure 129/71, pulse 88, temperature 97.8 F (36.6 C), temperature source Oral, resp. rate 18, SpO2 100 %.There is no weight on file to calculate BMI.  General Appearance: Casual  Eye Contact::  Good   Speech:  minimal as patient is deaf, interpreter used  Volume:  Normal  Mood:  Anxious and Depressed  Affect:  Congruent  Thought Process:  Coherent  Orientation:  Full (Time, Place, and Person)  Thought Content:  WDL  Suicidal Thoughts:  Yes.  without intent/plan  Homicidal Thoughts:  No  Memory:  Immediate;   Fair Recent;   Fair Remote;   Fair  Judgement:  Fair  Insight:  Fair  Psychomotor Activity:  Normal  Concentration:  Fair  Recall:  [employer] of Knowledge:Fair  Language: Good  Akathisia:  No  Handed:  Right  AIMS (if indicated):     Assets:  Housing Leisure Time Physical Health Resilience Social Support  ADL's:  Intact  Cognition: WNL  Sleep:      Treatment Plan Summary: Bipolar affective disorder, most recent episode depressed, moderate.  Daily assessments.  -Crisis stabilization -Medication management:  Depakote 500 mg BID increased to 750 mg BID for mood stabilization, Wellbutrin 300 mg daily for depression continued along with her medical medications.  Prozac 20 mg daily for depression started. -Individual counseling -Social worker referral  Disposition: Supportive therapy provided about ongoing stressors.  Waylan Boga, [place] 07/07/2015 2:42 PM   Patient seen face to face for psychiatric evaluation. Chart reviewed and finding discussed with Physician extender. Agreed with disposition and treatment plan.   Berniece Andreas, MD

## 2015-07-07 NOTE — ED Notes (Signed)
Pt ate 100% of her dinner 

## 2015-07-07 NOTE — ED Notes (Signed)
Awake. Verbally responsive. A/O x4. Resp even and unlabored. No audible adventitious breath sounds noted. ABC's intact. No behavior problems noted. Sitter at bedside.

## 2015-07-07 NOTE — ED Notes (Signed)
Pt ate 100% of her breakfast

## 2015-07-07 NOTE — Clinical Social Work Note (Signed)
CSW called and left message for pt's daughter to return call regarding pt discharge services and needs.  [facility] LCSW [facility]

## 2015-07-07 NOTE — ED Notes (Signed)
Awake. Verbally responsive. A/O x4. Resp even and unlabored. No audible adventitious breath sounds noted. ABC's intact. Pt eaten 100% of breakfast. Pt had emesis x1 after eating breakfast. Pt given shower. Interpreter at bedside. Sitter at bedside.

## 2015-07-07 NOTE — ED Notes (Signed)
Awake. Verbally responsive. A/O x4. Resp even and unlabored. No audible adventitious breath sounds noted. ABC's intact.  Pt watching TV. Sitter at bedside.

## 2015-07-07 NOTE — Clinical Social Work Note (Signed)
CSW scheduled a meeting for 8;15 am tomorrow (Sunday) with pt and her daughter and an interpretor to help facilitate pt communication and discharge needs.  The interpretor is also available to meet with the MD/Psychiatrist and NP's if needed when rounding in the morning.  Elray Buba LCSW [place] [facility] [phone]

## 2015-07-07 NOTE — ED Notes (Signed)
Pt ate 100 % of her lunch today.

## 2015-07-07 NOTE — Progress Notes (Signed)
Pt is deaf and has interpreters provided through [facility] who can be reached at [phone].  Interpreter left tonight at 2300 when pt went to sleep.  An interpreter will be back at 0900 due to being informed by TTS that pt will be seen by counselor in the am around 0930.

## 2015-07-08 ENCOUNTER — Inpatient Hospital Stay (HOSPITAL_COMMUNITY)
Admission: AD | Admit: 2015-07-08 | Discharge: 2015-07-10 | DRG: 885 | Disposition: A | Discharge status: Other Acute Inpatient Hospital | Payer: Medicare Other | Source: Intra-hospital | Attending: Psychiatry | Admitting: Psychiatry

## 2015-07-08 ENCOUNTER — Encounter (HOSPITAL_COMMUNITY): Payer: Self-pay | Admitting: *Deleted

## 2015-07-08 DIAGNOSIS — G4700 Insomnia, unspecified: Secondary | ICD-10-CM | POA: Diagnosis present

## 2015-07-08 DIAGNOSIS — Z818 Family history of other mental and behavioral disorders: Secondary | ICD-10-CM | POA: Diagnosis not present

## 2015-07-08 DIAGNOSIS — F1021 Alcohol dependence, in remission: Secondary | ICD-10-CM

## 2015-07-08 DIAGNOSIS — F315 Bipolar disorder, current episode depressed, severe, with psychotic features: Principal | ICD-10-CM | POA: Diagnosis present

## 2015-07-08 DIAGNOSIS — Z87891 Personal history of nicotine dependence: Secondary | ICD-10-CM

## 2015-07-08 DIAGNOSIS — F419 Anxiety disorder, unspecified: Secondary | ICD-10-CM | POA: Diagnosis present

## 2015-07-08 DIAGNOSIS — Z8249 Family history of ischemic heart disease and other diseases of the circulatory system: Secondary | ICD-10-CM | POA: Diagnosis not present

## 2015-07-08 DIAGNOSIS — E119 Type 2 diabetes mellitus without complications: Secondary | ICD-10-CM | POA: Diagnosis present

## 2015-07-08 DIAGNOSIS — E785 Hyperlipidemia, unspecified: Secondary | ICD-10-CM | POA: Diagnosis present

## 2015-07-08 DIAGNOSIS — F1490 Cocaine use, unspecified, uncomplicated: Secondary | ICD-10-CM | POA: Diagnosis not present

## 2015-07-08 DIAGNOSIS — F3130 Bipolar disorder, current episode depressed, mild or moderate severity, unspecified: Secondary | ICD-10-CM | POA: Diagnosis present

## 2015-07-08 DIAGNOSIS — H9190 Unspecified hearing loss, unspecified ear: Secondary | ICD-10-CM | POA: Diagnosis present

## 2015-07-08 DIAGNOSIS — F3189 Other bipolar disorder: Secondary | ICD-10-CM | POA: Diagnosis not present

## 2015-07-08 DIAGNOSIS — R45851 Suicidal ideations: Secondary | ICD-10-CM | POA: Diagnosis present

## 2015-07-08 DIAGNOSIS — F1221 Cannabis dependence, in remission: Secondary | ICD-10-CM | POA: Diagnosis present

## 2015-07-08 DIAGNOSIS — Z833 Family history of diabetes mellitus: Secondary | ICD-10-CM

## 2015-07-08 DIAGNOSIS — Z79899 Other long term (current) drug therapy: Secondary | ICD-10-CM | POA: Diagnosis not present

## 2015-07-08 DIAGNOSIS — M542 Cervicalgia: Secondary | ICD-10-CM

## 2015-07-08 DIAGNOSIS — F1290 Cannabis use, unspecified, uncomplicated: Secondary | ICD-10-CM | POA: Diagnosis not present

## 2015-07-08 DIAGNOSIS — F333 Major depressive disorder, recurrent, severe with psychotic symptoms: Secondary | ICD-10-CM | POA: Diagnosis present

## 2015-07-08 DIAGNOSIS — F3131 Bipolar disorder, current episode depressed, mild: Secondary | ICD-10-CM | POA: Diagnosis present

## 2015-07-08 DIAGNOSIS — I10 Essential (primary) hypertension: Secondary | ICD-10-CM | POA: Diagnosis present

## 2015-07-08 DIAGNOSIS — F1421 Cocaine dependence, in remission: Secondary | ICD-10-CM | POA: Diagnosis present

## 2015-07-08 LAB — GLUCOSE, CAPILLARY: GLUCOSE-CAPILLARY: 128 mg/dL — AB (ref 65–99)

## 2015-07-08 LAB — CBG MONITORING, ED
Glucose-Capillary: 72 mg/dL (ref 65–99)
Glucose-Capillary: 111 mg/dL — ABNORMAL HIGH (ref 65–99)

## 2015-07-08 MED ORDER — ALUM & MAG HYDROXIDE-SIMETH 200-200-20 MG/5ML PO SUSP
30.0000 mL | ORAL | Status: DC | PRN
  Administered 2015-07-09: 30 mL via ORAL
  Filled 2015-07-08: qty 30

## 2015-07-08 MED ORDER — DIVALPROEX SODIUM 250 MG PO DR TAB
750.0000 mg | DELAYED_RELEASE_TABLET | Freq: Two times a day (BID) | ORAL | Status: DC
  Administered 2015-07-08 – 2015-07-09 (×2): 750 mg via ORAL
  Filled 2015-07-08 (×4): qty 3

## 2015-07-08 MED ORDER — INSULIN ASPART PROT & ASPART (70-30 MIX) 100 UNIT/ML ~~LOC~~ SUSP
20.0000 [IU] | Freq: Two times a day (BID) | SUBCUTANEOUS | Status: DC
Start: 2015-07-08 — End: 2015-07-10
  Administered 2015-07-09 – 2015-07-10 (×3): 20 [IU] via SUBCUTANEOUS

## 2015-07-08 MED ORDER — CYCLOBENZAPRINE HCL 5 MG PO TABS: 5.0000 mg | ORAL_TABLET | Freq: Three times a day (TID) | ORAL | Status: DC | PRN

## 2015-07-08 MED ORDER — ACETAMINOPHEN 325 MG PO TABS: 650.0000 mg | ORAL_TABLET | Freq: Four times a day (QID) | ORAL | Status: DC | PRN

## 2015-07-08 MED ORDER — MAGNESIUM HYDROXIDE 400 MG/5ML PO SUSP: 30.0000 mL | Freq: Every day | ORAL | Status: DC | PRN

## 2015-07-08 MED ORDER — ACETAMINOPHEN 500 MG PO TABS: 1000.0000 mg | ORAL_TABLET | Freq: Four times a day (QID) | ORAL | Status: DC | PRN

## 2015-07-08 MED ORDER — FLUOXETINE HCL 20 MG PO CAPS
20.0000 mg | ORAL_CAPSULE | Freq: Every day | ORAL | Status: DC
  Filled 2015-07-08 (×2): qty 1

## 2015-07-08 MED ORDER — LISINOPRIL 20 MG PO TABS
20.0000 mg | ORAL_TABLET | Freq: Every morning | ORAL | Status: DC
Start: 2015-07-09 — End: 2015-07-10
  Administered 2015-07-09 – 2015-07-10 (×2): 20 mg via ORAL
  Filled 2015-07-08 (×4): qty 1

## 2015-07-08 MED ORDER — METFORMIN HCL 500 MG PO TABS
500.0000 mg | ORAL_TABLET | Freq: Two times a day (BID) | ORAL | Status: DC
  Administered 2015-07-08 – 2015-07-10 (×5): 500 mg via ORAL
  Filled 2015-07-08 (×9): qty 1

## 2015-07-08 MED ORDER — BUPROPION HCL ER (XL) 300 MG PO TB24
300.0000 mg | ORAL_TABLET | Freq: Every day | ORAL | Status: DC
  Administered 2015-07-08: 300 mg via ORAL
  Filled 2015-07-08 (×3): qty 1

## 2015-07-08 NOTE — Progress Notes (Signed)
Patient ID: Stacie Adams, female   DOB: 1960/09/15, 55 y.o.   MRN: [id number] Per State regulations 482.30 this chart was reviewed for medical necessity with respect to the patient's admission/duration of stay.    Next review date: 07/12/15  Thurman Coyer, BSN, RN-BC  Case Manager

## 2015-07-08 NOTE — ED Notes (Signed)
Case worker, nurse, daughter and interp at bedside talking. Pt was eating and began to cough. It was reported by daughter that this has become an increased problem with pt. Daughter states it seems as she is "chocking". Pt was able to take a deep breath, no sob, took a sip of water, lungs clear. Pt states that she feels as though she has trouble swallowing at times. Took pills without difficulties. Daughter did not know if she was eating to fast. Daughter expressed also that pt has lost over 30 lbs un internal in the past 6 months. Pt denies any abd pain, no rectal bleeding. Does state that she has never had a colonoscopy, her mammogram is schedule for Feb 2. Daughter states colon CA runs in family uncle just passed away with it. Will notify MD of concerns.

## 2015-07-08 NOTE — ED Notes (Signed)
Pt stating she wants to leave, after reading in the chart, she was informed about the  meeting being held this morning. She in turn decided to stay and wait for breakfast and MD.

## 2015-07-08 NOTE — Progress Notes (Signed)
Adult Psychoeducational Group Note  Date:  07/08/2015 Time:  9:02 PM  Group Topic/Focus:  Wrap-Up Group:   The focus of this group is to help patients review their daily goal of treatment and discuss progress on daily workbooks.  Participation Level:  Active  Participation Quality:  Appropriate  Affect:  Appropriate  Cognitive:  Appropriate  Insight: Appropriate  Engagement in Group:  Engaged  Modes of Intervention:  Discussion  Additional Comments: The patient expressed her support system is her daughter.  Octavio Manns 07/08/2015, 9:02 PM

## 2015-07-08 NOTE — Progress Notes (Signed)
Patient ID: Stacie Adams, female   DOB: [birth year], 55 y.o.   MRN: [id number]   55 year old black female admitted after her daughter took her to the [facility] reporting that she was worried about her mother's safety. Pt reported that she lives at home with her daughter and her grandson. Pt reported that she lost her father and her brother and every since then she has been very confused, sad, and depressed. Pt reported that she was positive AH/VH, and that she was negative SI/HI. Pt reported that she felt very unsafe, and that her depression was a 10, hopelessness was a 10, and her anxiety was a 10. Pt reported that she was afraid to be here at [facility], and that when she was in inpt treatment before she was on a all deaf unit. Pt reported that she was extremely scared and that she could go back to the unit. Pt reported that she could not be around people and that she could not be at [facility]. Pt was very anxious at time of admission and had a hard time processing. Conrad NP was made aware of patient not welling to come back to the unit due to being afraid, orders noted to start patient on a 1:1. Pt on a 1:1 for safety, pts safety maintained.

## 2015-07-08 NOTE — Clinical Social Work Note (Signed)
CSW met with pt, her daughter and interpretor at bedside to discuss services.  CSW prompted pt and her daughter to discuss history and needs.   CSW encouraged pt and daughter to explore thoughts and feelings and provided supportive listening.    Pt is deaf and has been living with her daughter for past 3 years.  Pt has a history of inpatient psychiatric treatments but has not had any for the past 6 years.  Pt and her daughter described pt as being stable and attending medication management at [facility].  Both pt and daughter discussed a recent change in pt's behaviors that started 3 weeks ago. Pt had an anxiety attack 3 weeks ago and was brought to ED.  It was discussed with her at that time that her grief could be causing her anxiety due to the fact that both her father and brother passed away in the month of 07-04-2023. Pt followed up with [facility] after the anxiety attack and was started on 2 new medications (Wellbutrin and Haldol). Pt's symptoms increased and she was having paranoia and telling her daughter that somebody was after her.  Pt described being at home with the curtains pulled and not wanting to go out to her day program or wanting to eat stating that she just felt like she had "no life".  Pt also discussed hearing voices that were telling her "you will die, you will die".  Pt reported that she had been thinking about dying often lately and this is new for her.  Pt's blood sugar has also been going up and down over the course of these three weeks and so she was started on new medications for her diabetes too. Pt's daughter reported that over the past year pt has lost 30 pounds but she has been eating the same amount of food.  Pt reported that she feels like she cannot get enough food but is also having difficulty with swallowing.  She stated that she had not mentioned this to her doctors.  Pt's breakfast came in and she choked 3 times while eating her scrabble eggs.  The RN documented this in her  medical record.    Pt wants to go into inpatient and [facility] has a bed for her today.  CSW will follow up with coordinating the interpretor who will be needed for the transition to [facility] today. Pt will be going into Bed 507 bed [place] [facility] [phone]

## 2015-07-08 NOTE — Progress Notes (Signed)
Patient ID: Stacie Adams, female   DOB: 29-Mar-1961, 55 y.o.   MRN: [id number]   NURSING 1:1 NOTE  D: Pt in with SW completing assessment with the assistance of an interpretor. Pt continues to report AH/VH, no SI/HI noted. Pt reported that she remained scared to be on the unit, but felt a little better with being on a 1:1. Pt continues to report that her depression was a 10, her anxiety was a 10, and her hopelessness was a 10. A: 1:1 continued for patient safety. R: Pts safety maintained.

## 2015-07-08 NOTE — Clinical Social Work Note (Signed)
CSW met with pt and facilitated the consent for [facility] inpatient.  Per Sh[place] at [facility] pt may come as soon as interpretor is set up.  CSW spoke with interpretor who will meet pt at [facility] at 59.  [facility] was called for transport.  [place]

## 2015-07-08 NOTE — ED Notes (Signed)
Wrote on paper introduce myself. Explained that there is a meeting scheduled for this morning with doctor/family/interperater. Blood sugar was 111 waiting on breakfast for medications this am. Pt not responding to staff, but shook head.

## 2015-07-08 NOTE — [facility] Counselor (Addendum)
Patient has been accepted to 507-2 per Rosey Bath, RN, [facility].  Informed Psychiatrist of acceptance. CSW states that she will completed admission paperwork with patient. Nurse to nurse report at [phone], call to arrange admission time. Paperwork to go with patient to [facility].   Davina Poke, LCSW Therapeutic Triage Specialist [facility] 07/08/2015 10:09 AM

## 2015-07-08 NOTE — ED Notes (Signed)
Stacie Adams daughter would like to be called when pt is moved.please clairfy how communication with daughter will be once moved

## 2015-07-08 NOTE — Tx Team (Signed)
Initial Interdisciplinary Treatment Plan   PATIENT STRESSORS: Traumatic event   PATIENT STRENGTHS: Ability for insight General fund of knowledge   PROBLEM LIST: Problem List/Patient Goals Date to be addressed Date deferred Reason deferred Estimated date of resolution  "Depressed" 07/08/15           "Sad" 07/08/15                                          DISCHARGE CRITERIA:  Improved stabilization in mood, thinking, and/or behavior Need for constant or close observation no longer present  PRELIMINARY DISCHARGE PLAN: Return to previous living arrangement  PATIENT/FAMIILY INVOLVEMENT: This treatment plan has been presented to and reviewed with the patient, Stacie Adams, and/or family member.  The patient and family have been given the opportunity to ask questions and make suggestions.  Adelle Zachar, Maryjo Rochester Shanta 07/08/2015, 1:25 PM

## 2015-07-08 NOTE — BHH Suicide Risk Assessment (Signed)
[facility] Admission Suicide Risk Assessment   Nursing information obtained from:  Stacie Adams Demographic factors:  Low socioeconomic status Current Mental Status:  NA Loss Factors:  Loss of significant relationship Historical Factors:  Family history of suicide, NA Risk Reduction Factors:  Sense of responsibility to family  Total Time spent with Stacie Adams: 45 minutes Principal Problem: Bipolar affective disorder, currently depressed, mild (HCC) Diagnosis:   Stacie Adams Active Problem List   Diagnosis Date Noted  . Bipolar affective disorder, currently depressed, mild (HCC) [F31.31] 07/08/2015  . Bipolar affective disorder, current episode depressed (HCC) [F31.30] 07/07/2015  . Neck pain [M54.2] 05/24/2015  . Vaginal discharge [N89.8] 03/05/2015  . Dysuria [R30.0] 10/04/2014  . Hyperlipidemia [E78.5] 07/05/2014  . CAP (community acquired pneumonia) [J18.9] 05/22/2014  . Hypoxia [R09.02] 05/22/2014  . Deaf [H91.90] 05/22/2014  . Diabetes mellitus without complication (HCC) [E11.9]   . Bipolar disorder (HCC) [F31.9]   . Hypertension [I10]    Subjective Data: 55 Y/O female, deaf, who was admitted after she endorsed feeling depressed, anxious, feeling that there were people following her. She endorsed suicidal ideations. States things have been worst for the last 3 weeks. She describes the death of her father as a triggering event.  She has been staying in her room blinds down so people will not see her. She goes to [employer] and claims compliance with medications but feels the medications are not working. States that the medications were changed recently but she is not able to clarify if things are worst since her medications were changed. She names Depakote Wellbutrin Haldol as medications she takes Family History; denies family history of mental illness Past psych; has been [facility] has been hospitalized at [facility] before   Continued Clinical Symptoms:  Alcohol Use Disorder Identification  Test Final Score (AUDIT): 0 The "Alcohol Use Disorders Identification Test", Guidelines for Use in Primary Care, Second Edition.  World Science writer [facility]). Score between 0-7:  no or low risk or alcohol related problems. Score between 8-15:  moderate risk of alcohol related problems. Score between 16-19:  high risk of alcohol related problems. Score 20 or above:  warrants further diagnostic evaluation for alcohol dependence and treatment.   CLINICAL FACTORS:   Bipolar Disorder:   Depressive phase Depression:   Delusional More than one psychiatric diagnosis   Musculoskeletal: Strength & Muscle Tone: within normal limits Gait & Station: normal Stacie Adams leans: normal  Psychiatric Specialty Exam: Review of Systems  Constitutional: Positive for weight loss and malaise/fatigue.  HENT: Negative.   Eyes: Negative.   Respiratory: Negative.   Cardiovascular: Negative.   Gastrointestinal: Negative.   Genitourinary: Negative.   Musculoskeletal: Negative.   Skin: Negative.   Neurological: Negative.   Endo/Heme/Allergies: Negative.   Psychiatric/Behavioral: Positive for depression, suicidal ideas and memory loss. The Stacie Adams is nervous/anxious and has insomnia.     Blood pressure 134/75, pulse 75, temperature 97.8 F (36.6 C), temperature source Oral, resp. rate 19, height  (1.626 m), weight 77.111 kg (170 lb), SpO2 100 %.Body mass index is 29.17 kg/(m^2).  General Appearance: Fairly Groomed  Patent attorney::  Fair  Speech:  deaf   Volume:  fluctuates  Mood:  Anxious, Depressed and fearful worry  Affect:  Labile  Thought Process:  Coherent and Goal Directed  Orientation:  Full (Time, Place, and Person)  Thought Content:  symptoms events worries concerns  Suicidal Thoughts:  Yes.  without intent/plan  Homicidal Thoughts:  No  Memory:  Immediate;   Fair Recent;  Fair Remote;   Fair  Judgement:  Fair  Insight:  Shallow  Psychomotor Activity:  Restlessness  Concentration:   Fair  Recall:  Poor  Fund of Knowledge:Fair  Language: deaf  Akathisia:  No  Handed:  Right  AIMS (if indicated):     Assets:  Desire for Improvement Housing  Sleep:     Cognition: WNL  ADL's:  Intact    COGNITIVE FEATURES THAT CONTRIBUTE TO RISK:  Closed-mindedness, Polarized thinking and Thought constriction (tunnel vision)    SUICIDE RISK:   Moderate:  Frequent suicidal ideation with limited intensity, and duration, some specificity in terms of plans, no associated intent, good self-control, limited dysphoria/symptomatology, some risk factors present, and identifiable protective factors, including available and accessible social support.  PLAN OF CARE: Supportive approach/coping skills Depression; continue the Wellbutrin  Mood instability; continue the Depakote until we can get more information in terms of what medications she was taking that she thinks are not working for her anymore  Paranoia: reassess for the use of an antipsychotic. Will consider Abilify if she has not used it before after we get the EKG results Get get information from [facility] in terms of medication trials Get collateral information from her family to clarify how she has been functioning Labs: will get Hemoglobin A 1 C, lipid panel, TSH I certify that inpatient services furnished can reasonably be expected to improve the Stacie Adams's condition.   [name], MD 07/08/2015, 7:07 PM

## 2015-07-08 NOTE — ED Notes (Signed)
Pt asking for a stool softer states that she is having hard stools. She does not want a laxative. Will discuss with MD.
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
## 2015-07-08 NOTE — H&P (Signed)
Psychiatric Admission Assessment Adult  Patient Identification: Stacie Adams MRN:  [id number] Date of Evaluation:  07/08/2015 Chief Complaint:  Bipolar Disorder Principal Diagnosis: Bipolar affective disorder, currently depressed, mild (HCC) Diagnosis:   Patient Active Problem List   Diagnosis Date Noted  . Bipolar affective disorder, currently depressed, mild (HCC) [F31.31] 07/08/2015    Priority: High  . Bipolar affective disorder, current episode depressed (HCC) [F31.30] 07/07/2015  . Neck pain [M54.2] 05/24/2015  . Vaginal discharge [N89.8] 03/05/2015  . Dysuria [R30.0] 10/04/2014  . Hyperlipidemia [E78.5] 07/05/2014  . CAP (community acquired pneumonia) [J18.9] 05/22/2014  . Hypoxia [R09.02] 05/22/2014  . Deaf [H91.90] 05/22/2014  . Diabetes mellitus without complication (HCC) [E11.9]   . Bipolar disorder (HCC) [F31.9]   . Hypertension [I10]    History of Present Illness:: Stacie Adams is an 55 y.o. female who is deaf that presents to the emergency department with thoughts of self harm, but no plan. Patient requires an interpreter and states she was "thinking about cutting herself and doesn't want to go home because she just might do it." Patient states, "I probably would not want to do it, but I think about it a lot." She does have a laceration to her hand that she reports she cut when she was washing dishes and sliced her right hand with a kitchen knife just prior to arrival. Patient is currently being seen by [facility] where she receives medications for Bi-Polar and anxiety although she states she is currently compliant with her medications she could not recall what they were. She has had prior admission with [facility] with the last one being in 2011 where she presented with the same symptoms and followed up with [facility] where she continues to receives services. Patient states she has been having family problems with her daughter whom she lives with and they "fight all the time."  Patient was very anxious and agitated at the time of the assessment. 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 Case was staffed with Celso Amy PMH-NT who agreed patient meets criteria for inpatient admission as appropriate placement is investigated.  Objective: Pt seen and chart reviewed. Pt is alert/oriented x4, calm, cooperative, and appropriate to situation. Pt denies suicidal/homicidal ideation yet reports psychosis. She states she has auditory hallucinations in the form of voices but they are not completely clear. Pt denies paranoid ideation although presents as very anxious. She cites very poor sleep and good appetite; observed to be eating her tray for lunch. Pt reports going to [facility] and being prescribed Haldol but not filling the prescription. She is a poor historian overall. Will consider Haldol vs. Other antipsychotics after EKG and some lab work return. She is pleasant to converse with utilizing interpreter as pt is deaf, does not appear to be agitated and her responses are linear and appropriate.   Associated Signs/Symptoms: Depression Symptoms:  depressed mood, anhedonia, insomnia, psychomotor agitation, psychomotor retardation, fatigue, feelings of worthlessness/guilt, difficulty concentrating, (Hypo) Manic Symptoms:  Hallucinations, Impulsivity, Irritable Mood, Anxiety Symptoms:  Excessive Worry, Psychotic Symptoms:  Hallucinations: Auditory PTSD Symptoms: NA Total Time spent with patient: 45 minutes  Past Psychiatric History: psychosis, substance abuse but sober 6 years, MDD, Bipolar  Risk to Self: Is patient at risk for suicide?: No What has been your use of drugs/alcohol within the last 12 months?:  Pt has been sober 6 years - from alcohol, crack cocaine, marijuana,  tobacco Risk to Others:   Prior Inpatient Therapy:   Prior Outpatient Therapy:    Alcohol Screening: 1. How often do you have a drink containing alcohol?: Never 9. Have you or someone else been injured as a result of your drinking?: No 10. Has a relative or friend or a doctor or another health worker been concerned about your drinking or suggested you cut down?: No Alcohol Use Disorder Identification Test Final Score (AUDIT): 0 Brief Intervention: AUDIT score less than 7 or less-screening does not suggest unhealthy drinking-brief intervention not indicated Substance Abuse History in the last 12 months:  No. Consequences of Substance Abuse: NA Previous Psychotropic Medications: Yes  Psychological Evaluations: Yes  Past Medical History:  Past Medical History  Diagnosis Date  . Diabetes mellitus without complication (HCC)   . Bipolar disorder (HCC)   . Hypertension    History reviewed. No pertinent past surgical history. Family History:  Family History  Problem Relation Age of Onset  . Hypertension Mother   . Diabetes Mother   . Hypertension Father   . Diabetes Father   . Cancer Sister   . Cancer Brother   . Diabetes Other    Family Psychiatric  History: MDD Social History:  History  Alcohol Use No    Comment: rare     History  Drug Use No    Social History   Social History  . Marital Status: Legally Separated    Spouse Name: N/A  . Number of Children: 2  . Years of Education: 14   Occupational History  . Disability    Social History Main Topics  . Smoking status: Former Smoker -- 1.00 packs/day for 2 years    Quit date: 07/05/2004  . Smokeless tobacco: Never Used  . Alcohol Use: No     Comment: rare  . Drug Use: No  . Sexual Activity: Not Asked   Other Topics Concern  . None   Social History Narrative   Born and raised in [place], [place]. Currently resides in a house with her daughter and grandson. 1 dog. Fun: Shopping, partying    Denies any religious  beliefs that would effect health c   Additional Social History:    Pain Medications: none Prescriptions: none Over the Counter: none History of alcohol / drug use?: No history of alcohol / drug abuse                    Allergies:  No Known Allergies Lab Results:  Results for orders placed or performed during the hospital encounter of 07/06/15 (from the past 48 hour(s))  POC CBG, ED     Status: Abnormal   Collection Time: 07/06/15  7:18 PM  Result Value Ref Range   Glucose-Capillary 127 (H) 65 - 99 mg/dL  CBG monitoring, ED     Status: Abnormal   Collection Time: 07/06/15  9:25 PM  Result Value Ref Range   Glucose-Capillary 168 (H) 65 - 99 mg/dL  CBG monitoring, ED     Status: None   Collection Time: 07/07/15  8:38 AM  Result Value Ref Range   Glucose-Capillary 72 65 - 99 mg/dL  CBG monitoring, ED     Status: None   Collection Time: 07/07/15 11:57 AM  Result Value Ref Range   Glucose-Capillary 86 65 - 99 mg/dL   Comment 1 Notify RN    Comment 2 Document in Chart  CBG monitoring, ED     Status: Abnormal   Collection Time: 07/07/15  5:18 PM  Result Value Ref Range   Glucose-Capillary 127 (H) 65 - 99 mg/dL   Comment 1 Notify RN    Comment 2 Document in Chart   CBG monitoring, ED     Status: Abnormal   Collection Time: 07/07/15  9:46 PM  Result Value Ref Range   Glucose-Capillary 106 (H) 65 - 99 mg/dL  CBG monitoring, ED     Status: Abnormal   Collection Time: 07/08/15  7:35 AM  Result Value Ref Range   Glucose-Capillary 111 (H) 65 - 99 mg/dL    Metabolic Disorder Labs:  Lab Results  Component Value Date   HGBA1C 12.4* 05/24/2015   MPG 349* 01/30/2010   No results found for: PROLACTIN No results found for: CHOL, TRIG, HDL, CHOLHDL, VLDL, LDLCALC  Current Medications: Current Facility-Administered Medications  Medication Dose Route Frequency Provider Last Rate Last Dose  . acetaminophen (TYLENOL) tablet 1,000 mg  1,000 mg Oral Q6H PRN Charm Rings, NP       . acetaminophen (TYLENOL) tablet 650 mg  650 mg Oral Q6H PRN Charm Rings, NP      . alum & mag hydroxide-simeth (MAALOX/MYLANTA) 200-200-20 MG/5ML suspension 30 mL  30 mL Oral Q4H PRN Charm Rings, NP      . buPROPion (WELLBUTRIN XL) 24 hr tablet 300 mg  300 mg Oral QHS Charm Rings, NP      . cyclobenzaprine (FLEXERIL) tablet 5-10 mg  5-10 mg Oral TID PRN Charm Rings, NP      . divalproex (DEPAKOTE) DR tablet 750 mg  750 mg Oral BID Charm Rings, NP      . Melene Muller ON 07/09/2015] FLUoxetine (PROZAC) capsule 20 mg  20 mg Oral Daily Charm Rings, NP      . insulin aspart protamine- aspart (NOVOLOG MIX 70/30) injection 20 Units  20 Units Subcutaneous BID WC Charm Rings, NP      . Melene Muller ON 07/09/2015] lisinopril (PRINIVIL,ZESTRIL) tablet 20 mg  20 mg Oral q morning - 10a Charm Rings, NP      . magnesium hydroxide (MILK OF MAGNESIA) suspension 30 mL  30 mL Oral Daily PRN Charm Rings, NP      . metFORMIN (GLUCOPHAGE) tablet 500 mg  500 mg Oral BID WC Charm Rings, NP       PTA Medications: Prescriptions prior to admission  Medication Sig Dispense Refill Last Dose  . buPROPion (WELLBUTRIN XL) 300 MG 24 hr tablet Take 1 tablet (300 mg total) by mouth at bedtime. 90 tablet 1 07/08/2015 at Unknown time  . divalproex (DEPAKOTE) 500 MG DR tablet Take 1 tablet (500 mg total) by mouth 2 (two) times daily. 180 tablet 1 07/08/2015 at Unknown time  . glucose blood (ONE TOUCH ULTRA TEST) test strip 1 each by Other route 2 (two) times daily. Use to check blood sugars twice a day Dx E11.9 100 each 3 07/08/2015 at Unknown time  . glucose blood (ONETOUCH VERIO) test strip Use Blood Sugar Test Strips to Test blood sugar at least twice daily. Dx: E11.9 100 each 3 07/08/2015 at Unknown time  . insulin aspart protamine - aspart (NOVOLOG MIX 70/30 FLEXPEN) (70-30) 100 UNIT/ML FlexPen Inject 0.2 mLs (20 Units total) into the skin 2 (two) times daily. 15 mL 11 07/08/2015 at Unknown time  . Insulin Pen  Needle (BD ULTRA-FINE PEN NEEDLES)  29G X 12.7MM MISC Use 1 needle to inject insulin 2 times daily as instructed. Use a new needle for each injection. 100 each 5 07/08/2015 at Unknown time  . lisinopril (PRINIVIL,ZESTRIL) 20 MG tablet Take 1 tablet (20 mg total) by mouth every morning. 90 tablet 1 07/08/2015 at Unknown time  . metFORMIN (GLUCOPHAGE) 500 MG tablet TAKE 1 TABLET (500 MG TOTAL) BY MOUTH 2 (TWO) TIMES DAILY WITH A MEAL. 180 tablet 3 07/08/2015 at Unknown time  . Cholecalciferol (VITAMIN D3) 5000 UNITS CAPS Take 1 capsule by mouth daily.   Unknown at Unknown time  . cyclobenzaprine (FLEXERIL) 10 MG tablet Take 0.5-1 tablets (5-10 mg total) by mouth 3 (three) times daily as needed for muscle spasms. 60 tablet 0 Unknown at Unknown time  . fluconazole (DIFLUCAN) 150 MG tablet Take 1 tablet (150 mg total) by mouth once. (Patient not taking: Reported on 06/22/2015) 1 tablet 0 Unknown at Unknown time  . haloperidol (HALDOL) 5 MG tablet Take 5 mg by mouth.   Unknown at Unknown time  . latanoprost (XALATAN) 0.005 % ophthalmic solution Place 1 drop into both eyes at bedtime.  6 Unknown at Unknown time  . LORazepam (ATIVAN) 1 MG tablet Take 1 tablet (1 mg total) by mouth 3 (three) times daily as needed for anxiety. 6 tablet 0 Unknown at Unknown time  . naproxen (NAPROSYN) 500 MG tablet TAKE 1 TABLET (500 MG TOTAL) BY MOUTH 2 (TWO) TIMES DAILY WITH A MEAL. 60 tablet 0 Unknown at Unknown time  . rosuvastatin (CRESTOR) 10 MG tablet TAKE 1 TABLET (10 MG TOTAL) BY MOUTH AT BEDTIME. (Patient not taking: Reported on 06/27/2015) 90 tablet 1 Unknown at Unknown time    Musculoskeletal: Strength & Muscle Tone: within normal limits Gait & Station: normal Patient leans: N/A  Psychiatric Specialty Exam: Physical Exam  Review of Systems  Psychiatric/Behavioral: Positive for depression and hallucinations. Negative for suicidal ideas and substance abuse. The patient is nervous/anxious and has insomnia.   All other  systems reviewed and are negative.   Blood pressure 134/75, pulse 75, temperature 97.8 F (36.6 C), temperature source Oral, resp. rate 19, height 5\' 4"  (1.626 m), weight 77.111 kg (170 lb), SpO2 100 %.Body mass index is 29.17 kg/(m^2).  General Appearance: Disheveled  Eye Solicitor::  Fair  Speech:  Mute, with grunting, pt is deaf  Volume:  none, pt is deaf  Mood:  Anxious and Depressed  Affect:  Appropriate, Congruent and Depressed  Thought Process:  Circumstantial  Orientation:  Full (Time, Place, and Person)  Thought Content:  Hallucinations: Auditory  Suicidal Thoughts:  No  Homicidal Thoughts:  No  Memory:  Immediate;   Fair Recent;   Fair Remote;   Fair  Judgement:  Fair  Insight:  Fair  Psychomotor Activity:  Normal  Concentration:  Fair  Recall:  [employer] of Knowledge:Fair  Language: Fair  Akathisia:  No  Handed:    AIMS (if indicated):     Assets:  Desire for Improvement Social Support Talents/Skills  ADL's:  Intact  Cognition: WNL  Sleep:       Treatment Plan Summary: Daily contact with patient to assess and evaluate symptoms and progress in treatment and Medication management  Medications: -Continue Wellbutrin 300mg  daily for MDD -Continue Flexeril 5mg  tid prn muscle spasms -Continue Depakote 750mg  bid for mood stabilization -Continue Novolog 70/30 20 units bid wc per home dose -Continue Metformin 500mg  bid wc -Continue Lisinopril 20mg  daily for HTN -Pt started on  [facility]aldol at [facility] (but did not take or fill script; will see EKG and consider options)  Labs: -Reviewed CBC, CMP -Ordered EKG. Ordered AM labs: Depakote Level, TSH, Lipid Panel, Prolactin, A1C.   Observation Level/Precautions:  15 minute checks  Laboratory:  Labs resulted, reviewed, and stable at this time.   Psychotherapy:  Group therapy, individual therapy, psychoeducation  Medications:  See MAR above  Consultations: None    Discharge Concerns: None    Estimated LOS: 5-7 days  Other:   N/A    I certify that inpatient services furnished can reasonably be expected to improve the patient's condition.    Beau Fanny, FNP-BC 1/29/20174:37 PM I personally assessed the patient and formulated the plan Madie Reno A. Dub Mikes, M.D.

## 2015-07-08 NOTE — Progress Notes (Signed)
1:1 Nursing note ( late entry) Writer spoke with patient at 45 and wrote a note and introduced self to Clinical research associate and informed her of an interpreter coming at 10. Writer asked if having any pain and she reported no pain. When interpreter arrived she attended group with her and after group we informed her of scheduled medication and medications available if needed. She denies si/hi and visual halluciations but reports auditory hallucinations, reporting she hears the devil's voice telling her to die. Writer informed her seek staff to talk to if voices become intense to where she feel as if she will act upon them. Support given and safety maintained on unit with sitter by her side. 1:1 continues and patient is safe.

## 2015-07-08 NOTE — ED Notes (Signed)
Pt is being transferred over to [facility], called transport and daughter. Will call to give report.

## 2015-07-08 NOTE — Plan of Care (Signed)
Problem: Diagnosis: Increased Risk For Suicide Attempt Goal: STG-Patient Will Comply With Medication Regime Outcome: Progressing Patient attended the evening wrap up group.

## 2015-07-08 NOTE — ED Notes (Signed)
Rene Kocher CM, daughter, and interpreter at bedside to talk with pt and discuss a plan of care.

## 2015-07-09 DIAGNOSIS — F1221 Cannabis dependence, in remission: Secondary | ICD-10-CM | POA: Diagnosis present

## 2015-07-09 DIAGNOSIS — E119 Type 2 diabetes mellitus without complications: Secondary | ICD-10-CM

## 2015-07-09 DIAGNOSIS — F1490 Cocaine use, unspecified, uncomplicated: Secondary | ICD-10-CM

## 2015-07-09 DIAGNOSIS — F1021 Alcohol dependence, in remission: Secondary | ICD-10-CM

## 2015-07-09 DIAGNOSIS — F1421 Cocaine dependence, in remission: Secondary | ICD-10-CM | POA: Diagnosis present

## 2015-07-09 DIAGNOSIS — F1290 Cannabis use, unspecified, uncomplicated: Secondary | ICD-10-CM

## 2015-07-09 DIAGNOSIS — F315 Bipolar disorder, current episode depressed, severe, with psychotic features: Secondary | ICD-10-CM | POA: Diagnosis present

## 2015-07-09 LAB — GLUCOSE, CAPILLARY
GLUCOSE-CAPILLARY: 75 mg/dL (ref 65–99)
GLUCOSE-CAPILLARY: 186 mg/dL — AB (ref 65–99)
Glucose-Capillary: 125 mg/dL — ABNORMAL HIGH (ref 65–99)
Glucose-Capillary: 84 mg/dL (ref 65–99)
Glucose-Capillary: 56 mg/dL — ABNORMAL LOW (ref 65–99)

## 2015-07-09 LAB — LIPID PANEL
Cholesterol: 131 mg/dL (ref 0–200)
HDL: 50 mg/dL (ref >40)
LDL CALC: 70 mg/dL (ref 0–99)
TRIGLYCERIDES: 55 mg/dL (ref <150)
Total CHOL/HDL Ratio: 2.6 RATIO
VLDL: 11 mg/dL (ref 0–40)

## 2015-07-09 LAB — TSH: TSH: 1.177 u[IU]/mL (ref 0.350–4.500)

## 2015-07-09 MED ORDER — BENZTROPINE MESYLATE 0.5 MG PO TABS
0.5000 mg | ORAL_TABLET | Freq: Two times a day (BID) | ORAL | Status: DC
  Administered 2015-07-09 – 2015-07-10 (×3): 0.5 mg via ORAL
  Filled 2015-07-09 (×6): qty 1

## 2015-07-09 MED ORDER — LORAZEPAM 2 MG/ML IJ SOLN: 1.0000 mg | Freq: Four times a day (QID) | INTRAMUSCULAR | Status: DC | PRN

## 2015-07-09 MED ORDER — BUPROPION HCL ER (XL) 150 MG PO TB24
150.0000 mg | ORAL_TABLET | Freq: Every day | ORAL | Status: DC
  Administered 2015-07-09 – 2015-07-10 (×2): 150 mg via ORAL
  Filled 2015-07-09 (×3): qty 1

## 2015-07-09 MED ORDER — CYCLOBENZAPRINE HCL 5 MG PO TABS: 5.0000 mg | ORAL_TABLET | Freq: Three times a day (TID) | ORAL | Status: DC | PRN

## 2015-07-09 MED ORDER — TRAZODONE HCL 50 MG PO TABS
50.0000 mg | ORAL_TABLET | Freq: Every day | ORAL | Status: DC
  Administered 2015-07-09: 50 mg via ORAL
  Filled 2015-07-09 (×3): qty 1

## 2015-07-09 MED ORDER — GLUCERNA SHAKE PO LIQD
237.0000 mL | Freq: Three times a day (TID) | ORAL | Status: DC
  Administered 2015-07-09 – 2015-07-10 (×3): 237 mL via ORAL

## 2015-07-09 MED ORDER — DIVALPROEX SODIUM 250 MG PO DR TAB
750.0000 mg | DELAYED_RELEASE_TABLET | ORAL | Status: DC
  Administered 2015-07-09 – 2015-07-10 (×2): 750 mg via ORAL
  Filled 2015-07-09 (×6): qty 3

## 2015-07-09 MED ORDER — ARIPIPRAZOLE 5 MG PO TABS
2.5000 mg | ORAL_TABLET | Freq: Two times a day (BID) | ORAL | Status: DC
  Administered 2015-07-09 – 2015-07-10 (×2): 2.5 mg via ORAL
  Filled 2015-07-09 (×4): qty 1

## 2015-07-09 MED ORDER — BACITRACIN-NEOMYCIN-POLYMYXIN OINTMENT TUBE
TOPICAL_OINTMENT | Freq: Two times a day (BID) | CUTANEOUS | Status: DC | PRN
  Administered 2015-07-10: 10:00:00 via TOPICAL
  Filled 2015-07-09: qty 1
  Filled 2015-07-09: qty 15

## 2015-07-09 MED ORDER — LORAZEPAM 1 MG PO TABS
1.0000 mg | ORAL_TABLET | Freq: Four times a day (QID) | ORAL | Status: DC | PRN
  Administered 2015-07-09: 1 mg via ORAL
  Filled 2015-07-09: qty 1

## 2015-07-09 NOTE — Clinical Social Work Note (Signed)
Referral made to [facility].  Santa Genera, LCSW Lead Clinical Social Worker Phone:  [phone]

## 2015-07-09 NOTE — Progress Notes (Signed)
Hypoglycemic Event  CBG: 56  Treatment: 15 GM carbohydrate snack  Symptoms: None  Follow-up CBG: Time: 2055 CBG Result: 83  Possible Reasons for Event: Inadequate meal intake  Comments/MD notified: Continue to monitor patient.  Adult (Non-Pregnant) Hypoglycemia Protocol Treatment Guidelines  Curly Rim

## 2015-07-09 NOTE — Progress Notes (Signed)
Adult Psychoeducational Group Note  Date:  07/09/2015 Time:  9:41 PM  Group Topic/Focus:  Wrap-Up Group:   The focus of this group is to help patients review their daily goal of treatment and discuss progress on daily workbooks.  Participation Level:  Active  Participation Quality:  Appropriate  Affect:  Appropriate  Cognitive:  Alert  Insight: Appropriate  Engagement in Group:  Engaged  Modes of Intervention:  Discussion  Additional Comments:  On a scale from 1-10, (1=worse, 10= best) patient rated her day as a 6 because "I'm feeling a little anxious." Patient stated something positive that happened today was seeing her friends.   Stacie Adams Stacie Adams 07/09/2015, 9:41 PM

## 2015-07-09 NOTE — Progress Notes (Signed)
Patient lying in bed asleep with eyes closed and respirations even and unlabored, no distress ntoed. 1:1 continues and patient is safe with sitter at bedside.

## 2015-07-09 NOTE — Progress Notes (Signed)
[facility] MD Progress Note  07/09/2015 2:47 PM FALISA LAMORA  MRN:  [id number] Subjective: Pt states " I was feeling very depressed , paranoid and hopeless. I was also suicidal."   Objective: Neysa MYKELTI GOLDENSTEIN is a 55 y.o. AA female , who has a hx of Bipolar disorder, is deaf, lives with her daughter in [place], who presented to the emergency department at [facility] with thoughts of self harm, with plan. Pt seen today with an interpreter . Pt today seen as anxious , paranoid - states that her sx has been getting worse since the past several days. Pt also reports increased anxiety /paranoia - her sx are worsening due to her being around a lot os other pt on the unit who are restless and psychotic. Pt requests that she be transferred to [facility] since they have a deaf unit and she felt safe there last admission . Pt reports sleep as restless, appetite as reduced. Pt reports AH of noise as well as VH . Pt also appears being delusional - feels her sx are brought on by satan. Discussed medication changes with pt . Pt agrees with plan. Per staff pt continues to need 1;1 precaution due to being paranoid and anxious on the unit. Pt will continue to need encouragement and support.    Principal Problem: Bipolar disorder, curr episode depressed, severe, w/psychotic features (HCC) Diagnosis:   Patient Active Problem List   Diagnosis Date Noted  . Bipolar disorder, curr episode depressed, severe, w/psychotic features (HCC) [F31.5] 07/09/2015  . Diabetes mellitus (HCC) [E11.9] 07/09/2015  . Alcohol use disorder, moderate, in sustained remission (HCC) [F10.21] 07/09/2015  . Cocaine use disorder, moderate, in sustained remission [F14.90] 07/09/2015  . Cannabis use disorder, moderate, in sustained remission [F12.90] 07/09/2015  . Neck pain [M54.2] 05/24/2015  . Hyperlipidemia [E78.5] 07/05/2014  . Deaf [H91.90] 05/22/2014  . Hypertension [I10]    Total Time spent with patient: 30 minutes  Past Psychiatric  History: Pt has a hx of Bipolar disorder, has had several admissions to inpatient facilities since her twenties. Pt has had two suicide attempts in the past. Pt follows up with [facility].  Past Medical History:  Past Medical History  Diagnosis Date  . Diabetes mellitus without complication (HCC)   . Bipolar disorder (HCC)   . Hypertension   Family History:  Family History  Problem Relation Age of Onset  . Hypertension Mother   . Diabetes Mother   . Hypertension Father   . Diabetes Father   . Cancer Sister   . Cancer Brother   . Diabetes Other    Family Psychiatric  History: Pt denies hx of mental illness in her family. Social History: Pt lives with daughter in [place]. Pt is on SSD.  History  Alcohol Use No    Comment: rare     History  Drug Use No    Social History   Social History  . Marital Status: Legally Separated    Spouse Name: N/A  . Number of Children: 2  . Years of Education: 14   Occupational History  . Disability    Social History Main Topics  . Smoking status: Former Smoker -- 1.00 packs/day for 2 years    Quit date: 07/05/2004  . Smokeless tobacco: Never Used  . Alcohol Use: No     Comment: rare  . Drug Use: No  . Sexual Activity: Not Asked   Other Topics Concern  . None   Social History Narrative   Born  and raised in [place], [place]. Currently resides in a house with her daughter and grandson. 1 dog. Fun: Shopping, partying    Denies any religious beliefs that would effect health c   Additional Social History:    Pain Medications: none Prescriptions: none Over the Counter: none History of alcohol / drug use?: No history of alcohol / drug abuse                    Sleep: Poor  Appetite:  Fair  Current Medications: Current Facility-Administered Medications  Medication Dose Route Frequency Provider Last Rate Last Dose  . acetaminophen (TYLENOL) tablet 1,000 mg  1,000 mg Oral Q6H PRN Charm Rings, NP      . acetaminophen (TYLENOL)  tablet 650 mg  650 mg Oral Q6H PRN Charm Rings, NP      . alum & mag hydroxide-simeth (MAALOX/MYLANTA) 200-200-20 MG/5ML suspension 30 mL  30 mL Oral Q4H PRN Charm Rings, NP   30 mL at 07/09/15 1334  . ARIPiprazole (ABILIFY) tablet 2.5 mg  2.5 mg Oral BID Jomarie Longs, MD   2.5 mg at 07/09/15 1308  . benztropine (COGENTIN) tablet 0.5 mg  0.5 mg Oral BID Jomarie Longs, MD   0.5 mg at 07/09/15 1306  . buPROPion (WELLBUTRIN XL) 24 hr tablet 150 mg  150 mg Oral Daily Pailynn Vahey, MD   150 mg at 07/09/15 1308  . cyclobenzaprine (FLEXERIL) tablet 5 mg  5 mg Oral TID PRN Beau Fanny, FNP      . divalproex (DEPAKOTE) DR tablet 750 mg  750 mg Oral BH-qamhs Mande Auvil, MD      . insulin aspart protamine- aspart (NOVOLOG MIX 70/30) injection 20 Units  20 Units Subcutaneous BID WC Charm Rings, NP   20 Units at 07/09/15 0818  . lisinopril (PRINIVIL,ZESTRIL) tablet 20 mg  20 mg Oral q morning - 10a Charm Rings, NP   20 mg at 07/09/15 1019  . LORazepam (ATIVAN) tablet 1 mg  1 mg Oral Q6H PRN Jomarie Longs, MD   1 mg at 07/09/15 1334   Or  . LORazepam (ATIVAN) injection 1 mg  1 mg Intramuscular Q6H PRN Ardith Lewman, MD      . magnesium hydroxide (MILK OF MAGNESIA) suspension 30 mL  30 mL Oral Daily PRN Charm Rings, NP      . metFORMIN (GLUCOPHAGE) tablet 500 mg  500 mg Oral BID WC Charm Rings, NP   500 mg at 07/09/15 0816  . traZODone (DESYREL) tablet 50 mg  50 mg Oral QHS Jomarie Longs, MD        Lab Results:  Results for orders placed or performed during the hospital encounter of 07/08/15 (from the past 48 hour(s))  Glucose, capillary     Status: Abnormal   Collection Time: 07/08/15  4:44 PM  Result Value Ref Range   Glucose-Capillary 128 (H) 65 - 99 mg/dL  Glucose, capillary     Status: Abnormal   Collection Time: 07/08/15  9:16 PM  Result Value Ref Range   Glucose-Capillary 125 (H) 65 - 99 mg/dL  Glucose, capillary     Status: None   Collection Time: 07/09/15  6:37  AM  Result Value Ref Range   Glucose-Capillary 84 65 - 99 mg/dL  Lipid panel     Status: None   Collection Time: 07/09/15  6:50 AM  Result Value Ref Range   Cholesterol 131 0 - 200 mg/dL  Triglycerides 55 <150 mg/dL   HDL 50 >16 mg/dL   Total CHOL/HDL Ratio 2.6 RATIO   VLDL 11 0 - 40 mg/dL   LDL Cholesterol 70 0 - 99 mg/dL    Comment:        Total Cholesterol/HDL:CHD Risk Coronary Heart Disease Risk Table                     Men   Women  1/2 Average Risk   3.4   3.3  Average Risk       5.0   4.4  2 X Average Risk   9.6   7.1  3 X Average Risk  23.4   11.0        Use the calculated Patient Ratio above and the CHD Risk Table to determine the patient's CHD Risk.        ATP III CLASSIFICATION (LDL):  <100     mg/dL   Optimal  109-604  mg/dL   Near or Above                    Optimal  130-159  mg/dL   Borderline  540-981  mg/dL   High  >191     mg/dL   Very High Performed at [facility]   TSH     Status: None   Collection Time: 07/09/15  6:50 AM  Result Value Ref Range   TSH 1.177 0.350 - 4.500 uIU/mL    Comment: Performed at [facility]  Glucose, capillary     Status: None   Collection Time: 07/09/15 11:26 AM  Result Value Ref Range   Glucose-Capillary 75 65 - 99 mg/dL   Comment 1 Notify RN    Comment 2 Document in Chart     Physical Findings: AIMS:  , ,  ,  ,    CIWA:    COWS:     Musculoskeletal: Strength & Muscle Tone: within normal limits Gait & Station: normal Patient leans: N/A  Psychiatric Specialty Exam: Review of Systems  Psychiatric/Behavioral: Positive for depression, suicidal ideas and hallucinations. The patient is nervous/anxious.   All other systems reviewed and are negative.   Blood pressure 117/80, pulse 101, temperature 97.2 F (36.2 C), temperature source Oral, resp. rate 18, height 5\' 4"  (1.626 m), weight 77.111 kg (170 lb), SpO2 100 %.Body mass index is 29.17 kg/(m^2).  General Appearance: Disheveled  Eye  Solicitor::  Fair  Speech:  is deaf - communicates through ASL  Volume:  is deaf  Mood:  Anxious and Depressed  Affect:  Labile  Thought Process:  Irrelevant  Orientation:  Full (Time, Place, and Person)  Thought Content:  Delusions, Hallucinations: Auditory Visual, Paranoid Ideation and Rumination  Suicidal Thoughts:  Yes.  without intent/plan  Homicidal Thoughts:  No  Memory:  Immediate;   Fair Recent;   Fair Remote;   Fair  Judgement:  Impaired  Insight:  Shallow  Psychomotor Activity:  Restlessness  Concentration:  Poor  Recall:  [employer] of Knowledge:Fair  Language: Fair  Akathisia:  No  Handed:  Right  AIMS (if indicated):     Assets:  Desire for Improvement  ADL's:  Intact  Cognition: WNL  Sleep:      Treatment Plan Summary: KRESHA ABELSON is a 55 y.o. AA female , who has a hx of Bipolar disorder,is deaf , who presents with worsening depression, anxiety and paranoia. Will readjust medications since pt continues  to be depressed and psychotic and has sleep issues. Daily contact with patient to assess and evaluate symptoms and progress in treatment and Medication management   Reviewed past medical records,treatment plan. Will continue Depakote DR 750 mg po bid for mood sx. Depakote level in 5 days. Will change Wellbutrin from qhs to daily and reduce dose - with plan to taper off - Pt is having trouble with sleep and is also anxious. Wellbutrin can make her sx worse. Will add Abilify 2.5 mg po bid for mood sx/psychosis. Will add Cogentin 0.5 mg po bid for EPS. Will add Trazodone 50 mg po qhs for sleep. Will make available PRN medications as per agitation protocol. Continue 1:1 precaution for safety/paranoia being on the unit.   Will continue to monitor vitals ,medication compliance and treatment side effects while patient is here.  Will monitor for medical issues as well as call consult as needed.  Reviewed labs ,TSH - wnl, lipid panel- wnl, PL pending, Hba1c- pending  - will get EKG for qtc. Depakote level on 07/11/15. CSW will start working on disposition.  Patient to participate in therapeutic milieu .       Earla Charlie MD 07/09/2015, 2:47 PM

## 2015-07-09 NOTE — Progress Notes (Signed)
1:1 DAR Note: Stacie Adams has been in the day room much of the day.  She interacts with her 1:1 worker and with the interpreter.  She continues to be paranoid an scared to be alone on the unit.  She was able to go to they gym for group and was noted playing a game with a peer on another unit.  She denies any SI/HI.  She feels like her anxiety is a little better with the ativan given prn. She voiced that she wants to be transferred to [facility] because they have a special unit for individuals that are deaf.  Note left for social worker and talked with Dr. Elna Breslow.  Sitter remains for safety/security.  Encouraged continued participation in group and unit activities.  We will continue to monitor the progress towards her goals.  [name] remains safe on the unit.
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
## 2015-07-09 NOTE — Progress Notes (Signed)
1:1 12:00pm DAR Note: Stacie Adams remains on 1:1 with sitter by side.  She reports that she is very anxious and has racing thoughts.  She denies hearing voices or seeing visions.  She completed her self inventory and reports that her depression, hopelessness and anxiety are all 10/10.  She is very hesitant about taking medications and has to be instructed about why she is taking all the medications.  She does eventually take them.  She denies any pain or discomfort and appears to be in no physical distress.  She remains very paranoid and feels that people are following her around.  1:1 continues to for safety.  Encouraged continued participation in group and unit activities.  [name] remains safe on the unit.

## 2015-07-09 NOTE — Progress Notes (Signed)
1:1 8am DAR Note: Stacie Adams remains on 1:1 with sitter by side.  She denies SI/HI or A/V hallucinations.  She continues to voice that she is very anxious and requested something for anxiety.  Will notify Dr. Elna Breslow when she comes in this morning to order something for anxiety.  She denies any pain or discomfort and appears to be in no physical distress.  She did voice that she had difficulty sleeping.  She states that she ate well at breakfast.  Encouraged participation in group and unit activities.  Sitter remains by side for safety. We will continue to monitor the progress towards her goals.

## 2015-07-09 NOTE — Progress Notes (Signed)
1:1 DAR Note: Stacie Adams remains on 1:1 with sitter by side.  She has been in the day room much of the evening.  She denies SI/HI or A/V hallucinations.  She took her evening medications without difficulty.  She denies any pain or discomfort.  She was able to go to the cafeteria for dinner and ate well.  She informed Clinical research associate before she left that unit that she was not as nervous.  She denies any pain or discomfort and appears to be in no physical distress.  1:1 continues to for safety/security.  Encouraged continued participation in group and unit activities.  We will continue to monitor the progress towards her goals.  Stacie Adams remains safe on the unit.

## 2015-07-09 NOTE — Progress Notes (Signed)
Patient lying in bed asleep with eyes closed and respirations even and unlabored. Safety maintained and 1:1 continues.

## 2015-07-09 NOTE — [facility] Group Notes (Signed)
[facility] LCSW Group Therapy  07/09/2015 4:48 PM   Type of Therapy:  Group Therapy  Participation Level:  Active  Participation Quality:  Attentive  Affect:  Appropriate  Cognitive:  Appropriate  Insight:  Improving  Engagement in Therapy:  Engaged  Modes of Intervention:  Clarification, Education, Exploration and Socialization  Summary of Progress/Problems: Today's group focused on relapse prevention.  We defined the term, and then brainstormed on ways to prevent relapse.  Stayed the entire time, and paid attention to interpreter.  Declined to contribute the couple of times I asked for her input.  Ida Rogue 07/09/2015 , 4:48 PM

## 2015-07-10 LAB — HEMOGLOBIN A1C
Hgb A1c MFr Bld: 8.2 % — ABNORMAL HIGH (ref 4.8–5.6)
Mean Plasma Glucose: 189 mg/dL

## 2015-07-10 LAB — GLUCOSE, CAPILLARY
GLUCOSE-CAPILLARY: 83 mg/dL (ref 65–99)
GLUCOSE-CAPILLARY: 158 mg/dL — AB (ref 65–99)
Glucose-Capillary: 88 mg/dL (ref 65–99)
Glucose-Capillary: 62 mg/dL — ABNORMAL LOW (ref 65–99)

## 2015-07-10 MED ORDER — INSULIN ASPART PROT & ASPART (70-30 MIX) 100 UNIT/ML ~~LOC~~ SUSP: 16.0000 [IU] | Freq: Two times a day (BID) | SUBCUTANEOUS | Status: AC

## 2015-07-10 MED ORDER — OLANZAPINE 5 MG PO TBDP: 5.0000 mg | ORAL_TABLET | Freq: Four times a day (QID) | ORAL | Status: DC | PRN

## 2015-07-10 MED ORDER — CLONAZEPAM 0.5 MG PO TABS: 0.2500 mg | ORAL_TABLET | Freq: Three times a day (TID) | ORAL | Status: AC

## 2015-07-10 MED ORDER — METFORMIN HCL 500 MG PO TABS: ORAL_TABLET | ORAL | Status: AC

## 2015-07-10 MED ORDER — INSULIN ASPART 100 UNIT/ML ~~LOC~~ SOLN: 0.0000 [IU] | Freq: Every day | SUBCUTANEOUS | Status: DC

## 2015-07-10 MED ORDER — BUPROPION HCL 75 MG PO TABS
75.0000 mg | ORAL_TABLET | Freq: Every day | ORAL | Status: DC
  Filled 2015-07-10 (×2): qty 1

## 2015-07-10 MED ORDER — CLONAZEPAM 0.5 MG PO TABS
0.2500 mg | ORAL_TABLET | Freq: Three times a day (TID) | ORAL | Status: DC
  Administered 2015-07-10: 0.25 mg via ORAL
  Filled 2015-07-10: qty 1

## 2015-07-10 MED ORDER — CLONAZEPAM 0.25 MG PO TBDP: 0.2500 mg | ORAL_TABLET | Freq: Three times a day (TID) | ORAL | Status: DC

## 2015-07-10 MED ORDER — DIVALPROEX SODIUM 250 MG PO DR TAB: 750.0000 mg | DELAYED_RELEASE_TABLET | ORAL | Status: AC

## 2015-07-10 MED ORDER — INSULIN ASPART 100 UNIT/ML ~~LOC~~ SOLN: 0.0000 [IU] | Freq: Three times a day (TID) | SUBCUTANEOUS | Status: AC

## 2015-07-10 MED ORDER — INSULIN ASPART PROT & ASPART (70-30 MIX) 100 UNIT/ML ~~LOC~~ SUSP
16.0000 [IU] | Freq: Two times a day (BID) | SUBCUTANEOUS | Status: DC
  Administered 2015-07-10: 16 [IU] via SUBCUTANEOUS

## 2015-07-10 MED ORDER — BUPROPION HCL 75 MG PO TABS
75.0000 mg | ORAL_TABLET | Freq: Every day | ORAL | Status: DC
  Filled 2015-07-10: qty 1

## 2015-07-10 MED ORDER — ARIPIPRAZOLE 5 MG PO TABS
5.0000 mg | ORAL_TABLET | Freq: Two times a day (BID) | ORAL | Status: DC
  Administered 2015-07-10: 5 mg via ORAL
  Filled 2015-07-10 (×4): qty 1

## 2015-07-10 MED ORDER — CYCLOBENZAPRINE HCL 10 MG PO TABS
5.0000 mg | ORAL_TABLET | Freq: Three times a day (TID) | ORAL | Status: AC | PRN
Start: 2015-07-10 — End: ?

## 2015-07-10 MED ORDER — TRAZODONE HCL 100 MG PO TABS
100.0000 mg | ORAL_TABLET | Freq: Every day | ORAL | Status: DC
  Filled 2015-07-10 (×2): qty 1

## 2015-07-10 MED ORDER — BUPROPION HCL 75 MG PO TABS: 75.0000 mg | ORAL_TABLET | Freq: Every day | ORAL | Status: AC

## 2015-07-10 MED ORDER — INSULIN ASPART 100 UNIT/ML ~~LOC~~ SOLN
0.0000 [IU] | Freq: Three times a day (TID) | SUBCUTANEOUS | Status: DC
  Administered 2015-07-10: 2 [IU] via SUBCUTANEOUS

## 2015-07-10 MED ORDER — BENZTROPINE MESYLATE 0.5 MG PO TABS: 0.5000 mg | ORAL_TABLET | Freq: Two times a day (BID) | ORAL | Status: AC

## 2015-07-10 MED ORDER — INSULIN ASPART 100 UNIT/ML ~~LOC~~ SOLN: 0.0000 [IU] | Freq: Every day | SUBCUTANEOUS | Status: AC

## 2015-07-10 MED ORDER — OLANZAPINE 5 MG PO TBDP: 5.0000 mg | ORAL_TABLET | Freq: Four times a day (QID) | ORAL | Status: AC | PRN

## 2015-07-10 MED ORDER — LISINOPRIL 20 MG PO TABS: 20.0000 mg | ORAL_TABLET | Freq: Every morning | ORAL | Status: AC

## 2015-07-10 MED ORDER — ARIPIPRAZOLE 5 MG PO TABS: 5.0000 mg | ORAL_TABLET | Freq: Two times a day (BID) | ORAL | Status: AC

## 2015-07-10 MED ORDER — BACITRACIN-NEOMYCIN-POLYMYXIN OINTMENT TUBE: 1.0000 "application " | TOPICAL_OINTMENT | Freq: Two times a day (BID) | CUTANEOUS | Status: DC | PRN

## 2015-07-10 MED ORDER — BACITRACIN-NEOMYCIN-POLYMYXIN OINTMENT TUBE: 1.0000 "application " | TOPICAL_OINTMENT | Freq: Two times a day (BID) | CUTANEOUS | Status: AC | PRN

## 2015-07-10 MED ORDER — TRAZODONE HCL 100 MG PO TABS: 100.0000 mg | ORAL_TABLET | Freq: Every day | ORAL | Status: AC

## 2015-07-10 NOTE — [facility] Group Notes (Signed)
[facility] Group Notes:  (Counselor/Nursing/MHT/Case Management/Adjunct)  07/10/2015 1:15PM  Type of Therapy:  Group Therapy  Participation Level:  Active  Participation Quality:  Appropriate  Affect:  Flat  Cognitive:  Oriented  Insight:  Improving  Engagement in Group:  Limited  Engagement in Therapy:  Limited  Modes of Intervention:  Discussion, Exploration and Socialization  Summary of Progress/Problems: The topic for group was balance in life.  Pt participated in the discussion about when their life was in balance and out of balance and how this feels.  Pt discussed ways to get back in balance and short term goals they can work on to get where they want to be. Stayed the entire time, willing to participate to a point.  Confirmed that she feels very unbalanced, and cited factors of "poor sleep and adjusting to medication."  When asked how medication made her feel, she replied "suicidal."  When pressed for more information, refused to respond.   Daryel Gerald B 07/10/2015 1:22 PM

## 2015-07-10 NOTE — Discharge Summary (Signed)
Physician Discharge Summary Note  Patient:  Stacie Adams is an 55 y.o., female MRN:  [id number] DOB:  July 16, 1960 Patient phone:  [phone] (home)  Patient address:   [address] [place] [address],  Total Time spent with patient: Greater than 30 minutes  Date of Admission:  07/08/2015 Date of Discharge: 06-30-15  Reason for Admission: Worsening symptoms of Bipolar disorder  Principal Problem: Bipolar disorder, curr episode depressed, severe, w/psychotic features [facility])  Discharge Diagnoses: Patient Active Problem List   Diagnosis Date Noted  . Bipolar disorder, curr episode depressed, severe, w/psychotic features (HCC) [F31.5] 07/09/2015  . Diabetes mellitus (HCC) [E11.9] 07/09/2015  . Alcohol use disorder, moderate, in sustained remission (HCC) [F10.21] 07/09/2015  . Cocaine use disorder, moderate, in sustained remission [F14.90] 07/09/2015  . Cannabis use disorder, moderate, in sustained remission [F12.90] 07/09/2015  . Neck pain [M54.2] 05/24/2015  . Hyperlipidemia [E78.5] 07/05/2014  . Deaf [H91.90] 05/22/2014  . Hypertension [I10]     Past Psychiatric History: Bipolar affective disorder  Past Medical History:  Past Medical History  Diagnosis Date  . Diabetes mellitus without complication (HCC)   . Bipolar disorder (HCC)   . Hypertension    History reviewed. No pertinent past surgical history.  Family History:  Family History  Problem Relation Age of Onset  . Hypertension Mother   . Diabetes Mother   . Hypertension Father   . Diabetes Father   . Cancer Sister   . Cancer Brother   . Diabetes Other    Family Psychiatric  History: See H&P  Social History:  History  Alcohol Use No    Comment: rare     History  Drug Use No    Social History   Social History  . Marital Status: Legally Separated    Spouse Name: N/A  . Number of Children: 2  . Years of Education: 14   Occupational History  . Disability    Social History Main Topics   . Smoking status: Former Smoker -- 1.00 packs/day for 2 years    Quit date: 07/05/2004  . Smokeless tobacco: Never Used  . Alcohol Use: No     Comment: rare  . Drug Use: No  . Sexual Activity: Not Asked   Other Topics Concern  . None   Social History Narrative   Born and raised in [place], [place]. Currently resides in a house with her daughter and grandson. 1 dog. Fun: Shopping, partying    Denies any religious beliefs that would effect health c   Hospital Course:Stacie Adams is an 55 y.o. female who is deaf that presents to the emergency department with thoughts of self harm, but no plan. Patient requires an interpreter and states she was "thinking about cutting herself and doesn't want to go home because she just might do it." Patient states, "I probably would not want to do it, but I think about it a lot." She does have a laceration to her hand that she reports she cut when she was washing dishes and sliced her right hand with a kitchen knife just prior to arrival. Patient is currently being seen by [facility] where she receives medications for Bi-Polar and anxiety although she states she is currently compliant with her medications she could not recall what they were. She has had prior admission with [facility] with the last one being in 2011 where she presented with the same symptoms and followed up with [facility] where she continues to receives services.  Stacie Adams was admitted to this hospital for mood stabilization treatments for worsening symptoms of Bipolar disorder & suicidal ideations. She is currently being discharged from [facility] to transfer to [facility] to continue mood stabilization treatment. She is being transferred with all her medication regimen for medical problems & psychiatric issues. She left [facility] in no apparent distress per [facility] transport.  Physical Findings: AIMS:  , ,  ,  ,    CIWA:    COWS:     Musculoskeletal: Strength & Muscle Tone: within normal  limits Gait & Station: normal Patient leans: N/A  Psychiatric Specialty Exam: Review of Systems  Constitutional: Negative.   HENT: Negative.   Eyes: Negative.   Respiratory: Negative.   Cardiovascular: Negative.   Gastrointestinal: Negative.   Genitourinary: Negative.   Musculoskeletal: Negative.   Skin: Negative.   Neurological: Negative.   Endo/Heme/Allergies: Negative.   Psychiatric/Behavioral: Positive for depression and substance abuse (Polysubstance use disorder). Negative for suicidal ideas, hallucinations and memory loss. The patient has insomnia ("Improving"). The patient is not nervous/anxious.     Blood pressure 120/80, pulse 102, temperature 97.8 F (36.6 C), temperature source Oral, resp. rate 16, height  (1.626 m), weight 77.111 kg (170 lb), SpO2 100 %.Body mass index is 29.17 kg/(m^2).  See Md's SRA   Have you used any form of tobacco in the last 30 days? (Cigarettes, Smokeless Tobacco, Cigars, and/or Pipes): No  Has this patient used any form of tobacco in the last 30 days? (Cigarettes, Smokeless Tobacco, Cigars, and/or Pipes): N/A  Metabolic Disorder Labs:  Lab Results  Component Value Date   HGBA1C 8.2* 07/09/2015   MPG 189 07/09/2015   MPG 349* 01/30/2010   No results found for: PROLACTIN Lab Results  Component Value Date   CHOL 131 07/09/2015   TRIG 55 07/09/2015   HDL 50 07/09/2015   CHOLHDL 2.6 07/09/2015   VLDL 11 07/09/2015   LDLCALC 70 07/09/2015   See Psychiatric Specialty Exam and Suicide Risk Assessment completed by Attending Physician prior to discharge.  Discharge destination:  Other:  [facility]  Is patient on multiple antipsychotic therapies at discharge:  Yes,   Do you recommend tapering to monotherapy for antipsychotics?  Yes (May discontinue Olanzapine disintegrating tablets if the agitation subsides).   Has Patient had three or more failed trials of antipsychotic monotherapy by history:  No  Recommended Plan for  Multiple Antipsychotic Therapies: Additional reason(s) for multiple antispychotic treatment:  Abilify for mood control & Olanzapine disntegrating tablets on prn basis for agitation    Medication List    STOP taking these medications        buPROPion 300 MG 24 hr tablet  Commonly known as:  WELLBUTRIN XL  Replaced by:  buPROPion 75 MG tablet     fluconazole 150 MG tablet  Commonly known as:  DIFLUCAN     glucose blood test strip  Commonly known as:  ONE TOUCH ULTRA TEST     haloperidol 5 MG tablet  Commonly known as:  HALDOL     insulin aspart protamine - aspart (70-30) 100 UNIT/ML FlexPen  Commonly known as:  NOVOLOG MIX 70/30 FLEXPEN  Replaced by:  insulin aspart protamine- aspart (70-30) 100 UNIT/ML injection     Insulin Pen Needle 29G X 12.7MM Misc  Commonly known as:  BD ULTRA-FINE PEN NEEDLES     latanoprost 0.005 % ophthalmic solution  Commonly known as:  XALATAN     LORazepam 1 MG tablet  Commonly  known as:  ATIVAN     naproxen 500 MG tablet  Commonly known as:  NAPROSYN     rosuvastatin 10 MG tablet  Commonly known as:  CRESTOR     Vitamin D3 5000 units Caps      TAKE these medications      Indication   ARIPiprazole 5 MG tablet  Commonly known as:  ABILIFY  Take 1 tablet (5 mg total) by mouth 2 (two) times daily. For mood control   Indication:  Mood control     benztropine 0.5 MG tablet  Commonly known as:  COGENTIN  Take 1 tablet (0.5 mg total) by mouth 2 (two) times daily. For prevention of drug induced tremors   Indication:  Extrapyramidal Reaction caused by Medications     buPROPion 75 MG tablet  Commonly known as:  WELLBUTRIN  Take 1 tablet (75 mg total) by mouth daily. For depression  Start taking on:  07/11/2015   Indication:  Major Depressive Disorder     clonazePAM 0.5 MG tablet  Commonly known as:  KLONOPIN  Take 0.5 tablets (0.25 mg total) by mouth 3 (three) times daily. For anxiety   Indication:  Anxiety     cyclobenzaprine 10 MG  tablet  Commonly known as:  FLEXERIL  Take 0.5-1 tablets (5-10 mg total) by mouth 3 (three) times daily as needed for muscle spasms.   Indication:  Muscle Spasm     divalproex 250 MG DR tablet  Commonly known as:  DEPAKOTE  Take 3 tablets (750 mg total) by mouth 2 (two) times daily in the am and at bedtime.. For mood stabilization   Indication:  Mood stabilization     insulin aspart 100 UNIT/ML injection  Commonly known as:  novoLOG  Inject 0-5 Units into the skin at bedtime. For diabetic management   Indication:  Type 2 Diabetes     insulin aspart 100 UNIT/ML injection  Commonly known as:  novoLOG  Inject 0-9 Units into the skin 3 (three) times daily with meals. For diabetes management   Indication:  Type 2 Diabetes     insulin aspart protamine- aspart (70-30) 100 UNIT/ML injection  Commonly known as:  NOVOLOG MIX 70/30  Inject 0.16 mLs (16 Units total) into the skin 2 (two) times daily with a meal. For diabetes management   Indication:  Type 2 Diabetes     lisinopril 20 MG tablet  Commonly known as:  PRINIVIL,ZESTRIL  Take 1 tablet (20 mg total) by mouth every morning. For high blood pressure   Indication:  High Blood Pressure     metFORMIN 500 MG tablet  Commonly known as:  GLUCOPHAGE  TAKE 1 TABLET (500 MG TOTAL) BY MOUTH 2 (TWO) TIMES DAILY WITH A MEAL: For diabetes managment   Indication:  Type 2 Diabetes     neomycin-bacitracin-polymyxin Oint  Commonly known as:  NEOSPORIN  Apply 1 application topically 2 (two) times daily as needed for wound care.   Indication:  Wound care     OLANZapine zydis 5 MG disintegrating tablet  Commonly known as:  ZYPREXA  Take 1 tablet (5 mg total) by mouth every 6 (six) hours as needed (severe anxiety/agitation).   Indication:  Agitation     traZODone 100 MG tablet  Commonly known as:  DESYREL  Take 1 tablet (100 mg total) by mouth at bedtime. For sleep   Indication:  Trouble Sleeping       Follow-up Information    Follow up  with [facility]  Hospital On 07/10/2015.   Why:  sheriff will transport you today     Follow-up recommendations: Activity:  As tolerated Diet: As recommended by your primary care doctor. Keep all scheduled follow-up appointments as recommended.  Comments: Take all your medications as prescribed by your mental healthcare provider. Report any adverse effects and or reactions from your medicines to your outpatient provider promptly. Patient is instructed and cautioned to not engage in alcohol and or illegal drug use while on prescription medicines. In the event of worsening symptoms, patient is instructed to call the crisis hotline, 911 and or go to the nearest ED for appropriate evaluation and treatment of symptoms. Follow-up with your primary care provider for your other medical issues, concerns and or health care needs.  Signed: Sanjuana Kava, PMHNP, FNP-BC 07/10/2015, 4:05 PM

## 2015-07-10 NOTE — Progress Notes (Signed)
  [facility] Adult Case Management Discharge Plan :  Will you be returning to the same living situation after discharge:  No. At discharge, do you have transportation home?: Yes,  sheriff Do you have the ability to pay for your medications: Yes,  MCR  Release of information consent forms completed and in the chart;  Patient's signature needed at discharge.  Patient to Follow up at: Follow-up Information    Follow up with [facility] On 07/10/2015.   Why:  sheriff will transport you today      Next level of care provider has access to [facility] Link:no  Safety Planning and Suicide Prevention discussed: Yes,  yes  Have you used any form of tobacco in the last 30 days? (Cigarettes, Smokeless Tobacco, Cigars, and/or Pipes): No  Has patient been referred to the Quitline?: N/A patient is not a smoker  Patient has been referred for addiction treatment: N/A  Ida Rogue 07/10/2015, 3:53 PM

## 2015-07-10 NOTE — Progress Notes (Signed)
1:1 Note;  Dar Note   D: Patient observed in room at beginning of shift interacting with visitors. Patient in pleasant mood. Patient with no concerns voiced. Patient with sitter at bedside at all times within arms length.  A: Patient encouraged to attend group tonight with interpreter. Patient agreed.  Q 15 minute checks in progress and maintained. Sitter remains within arms length at all times with patient.   Support and encouragement offered.  R: Patient remains safe on unit. Sitter remains at bedside. Monitoring continues.

## 2015-07-10 NOTE — Progress Notes (Signed)
Close Observation DAR Note: Stacie Adams has been changed to close observation.  She denies any SI/HI or A/V hallucinations.  She has been up and wandering the halls.  She is very intrusive and demanding about leaving to go to [place] and getting her clothing that is to be delivered by her daughter.  She can be difficult to redirect at times.  She took her medications but writer had to review her medications and explain why she is taking those medications.  She completed her self inventory and reports that her depression, hopelessness and anxiety are 10/10.  She states that she doesn't know what her goal is today.  Encouraged participation in group and unit activities.  Close observation continues for safety.  We will continue to monitor the progress towards her goals.

## 2015-07-10 NOTE — Progress Notes (Addendum)
Pt accepted at [facility] today.  Sheriff to transport.  All appropriate paperwork faxed.  Please call in report at time of d/c to Amy at [phone].  I called daughter to let her know of transfer.

## 2015-07-10 NOTE — Progress Notes (Signed)
1:1 Note  Patient observed in bed asleep. Sitter at bedside within arms length. Q 15 minute checks in progress and maintained. Monitoring continues.

## 2015-07-10 NOTE — Progress Notes (Signed)
1:1 Note  Patient is asleep and sitter continues at bedside within arms length for safety. Q 15 minute checks maintained and monitoring continues.

## 2015-07-10 NOTE — [facility] Group Notes (Signed)
[facility] Group Notes:  (Nursing/MHT/Case Management/Adjunct)  Date:  07/10/2015  Time: 9:30am  Type of Therapy:  Nurse Education  Participation Level:  Minimal  Participation Quality:  Appropriate  Affect:  Blunted  Cognitive:  Appropriate  Insight:  Impaired  Engagement in Group:  Attentive  Modes of Intervention:  Discussion and Education  Summary of Progress/Problems:  Group topic was Recovery.  Discussed goal setting, sleep hygiene and coping skills.  She was attentive during group with the assistance from the sign language interpreter.  She voiced not issues or questions.   Norm Parcel Marzell Allemand 07/10/2015, 11:35 AM

## 2015-07-10 NOTE — Progress Notes (Signed)
D/C note: Pt. D/C from the unit to lobby accompanied by [facility] department to be transported to [facility].  Report called to [place] at [facility].  She was pleasant and cooperative. She voiced no SI/HI or A/V halluciations.  Pt. Denies any pain or discomfort.  D/C instructions and medications reviewed with pt.  Pt. verbalized understanding of medications and d/c instructions.  Paperwork given to [facility] department to be given to staff at [facility].  All belongings (from locker - shoes, coat, wallet, money, jewelry, cell phone with charger) returned to pt. Q 15 min checks maintained until discharge.  Pt. Left the unit in no apparent distress.

## 2015-07-10 NOTE — Progress Notes (Signed)
Close Observation DAR Note: Stacie Adams remains on close observation.  She has been attending groups. Interacting with close observation worker and interpreters.  She continues to be focused on going to [place].  She denies any SI/HI or A/V hallucinations.  She continues to complain that she isn't able to swallow.   Encouraged her to eat small bites and chew really well before swallowing.  MD is aware of the complaints.  She took medications without difficulty.  Encouraged participation in group and unit activities.  Close observation remains for safety.  We will continue to monitor the progress towards her goals.

## 2015-07-10 NOTE — [facility] Suicide Risk Assessment (Signed)
[facility] Discharge Suicide Risk Assessment   Principal Problem: Bipolar disorder, curr episode depressed, severe, w/psychotic features [facility]) Discharge Diagnoses:  Patient Active Problem List   Diagnosis Date Noted  . Bipolar disorder, curr episode depressed, severe, w/psychotic features (HCC) [F31.5] 07/09/2015  . Diabetes mellitus (HCC) [E11.9] 07/09/2015  . Alcohol use disorder, moderate, in sustained remission (HCC) [F10.21] 07/09/2015  . Cocaine use disorder, moderate, in sustained remission [F14.90] 07/09/2015  . Cannabis use disorder, moderate, in sustained remission [F12.90] 07/09/2015  . Neck pain [M54.2] 05/24/2015  . Hyperlipidemia [E78.5] 07/05/2014  . Deaf [H91.90] 05/22/2014  . Hypertension [I10]     Total Time spent with patient: 30 minutes  Musculoskeletal: Strength & Muscle Tone: within normal limits Gait & Station: normal Patient leans: N/A  Psychiatric Specialty Exam: Review of Systems  Psychiatric/Behavioral: Positive for depression and hallucinations. The patient is nervous/anxious and has insomnia.   All other systems reviewed and are negative.   Blood pressure 120/80, pulse 102, temperature 97.8 F (36.6 C), temperature source Oral, resp. rate 16, height  (1.626 m), weight 77.111 kg (170 lb), SpO2 100 %.Body mass index is 29.17 kg/(m^2).  General Appearance: Disheveled  Eye Solicitor::  Fair  Speech:  uses [id number]  Volume:  is deaf , uses ASL  Mood:  Anxious and Depressed  Affect:  Congruent  Thought Process:  Irrelevant  Orientation:  Full (Time, Place, and Person)  Thought Content:  Delusions, Hallucinations: Auditory, Paranoid Ideation and Rumination  Suicidal Thoughts:  No  Homicidal Thoughts:  No  Memory:  Immediate;   Fair Recent;   Fair Remote;   Fair  Judgement:  Impaired  Insight:  Shallow  Psychomotor Activity:  Normal  Concentration:  Poor  Recall:  [employer] of Knowledge:Fair  Language: Fair  Akathisia:  No  Handed:  Right  AIMS  (if indicated):     Assets:  Social Support  Sleep:  Number of Hours: 5.75  Cognition: WNL  ADL's:  Intact   Mental Status Per Nursing Assessment::   On Admission:  NA  Demographic Factors:  Female, single  Loss Factors: Decrease in vocational status  Historical Factors: Impulsivity  Risk Reduction Factors:   Positive social support  Continued Clinical Symptoms:  Previous Psychiatric Diagnoses and Treatments  Cognitive Features That Contribute To Risk:  None    Suicide Risk:  Moderate:  Frequent suicidal ideation with limited intensity, and duration, some specificity in terms of plans, no associated intent, good self-control, limited dysphoria/symptomatology, some risk factors present, and identifiable protective factors, including available and accessible social support.  Follow-up Information    Go to [facility].   Specialty:  Behavioral Health   Why:  Appointment as planned   Contact information:   [address] [place] [address] [phone]       Go to [facility]   Why:  Regularly scheduled days at the Psychosocial Rehabilitation, Monday through Friday   Contact information:   [address], [place], [place] [address] Phone: [phone]      Plan Of Care/Follow-up recommendations:  Activity:  no restrictions Diet:  carb modified Tests:  as needed Other:  patient transferred to [facility] hospital for higher level of care  Dajohn Ellender, MD 07/10/2015, 3:28 PM

## 2015-07-10 NOTE — Progress Notes (Signed)
Inpatient Diabetes Program Recommendations  AACE/ADA: New Consensus Statement on Inpatient Glycemic Control (2015)  Target Ranges:  Prepandial:   less than 140 mg/dL      Peak postprandial:   less than 180 mg/dL (1-2 hours)      Critically ill patients:  140 - 180 mg/dL   Review of Glycemic Control  Diabetes history: DM2 Outpatient Diabetes medications: metformin 500 bid, Novolog 70/30 20 units bid Current orders for Inpatient glycemic control: Novolog 70/30 20 units bid, metformin 500 mg bid  55 y.o. AA female , who has a hx of Bipolar disorder, is deaf, lives with her daughter in [place], who presented to the emergency department at [facility] with thoughts of self-harm, with plan.  Results for NIOMA, MCCUBBINS (MRN [id number]) as of 07/10/2015 11:15  Ref. Range 07/09/2015 11:26 07/09/2015 16:20 07/09/2015 20:40 07/09/2015 20:59 07/10/2015 06:19  Glucose-Capillary Latest Ref Range: 65-99 mg/dL 75 119 (H) 56 (L) 83 88  Results for LIZANIA, BOUCHARD (MRN [id number]) as of 07/10/2015 11:15  Ref. Range 07/09/2015 06:50  Hemoglobin A1C Latest Ref Range: 4.8-5.6 % 8.2 (H)  Much improved Hgba1C since December 2016 - 12.4%. Needs correction insulin. May need to lower 70/30 dose with hypoglycemia.  Inpatient Diabetes Program Recommendations: Decrease 70/30 to 16 units bid. Add Novolog sensitive tidwc and hs.  Will follow. Thank you. Ailene Ards, RD, LDN, CDE Inpatient Diabetes Coordinator [phone]

## 2015-07-10 NOTE — Progress Notes (Signed)
Close observation DAR Note: Stacie Adams has been visible on the unit.  She has been in the day room with sitter within eye sight.  She continues to deny SI/HI or A/V hallucinations.  She has taken her medications without difficulty.  She is happy that she will be going to [place] today because she is able to interact with people and not just sitting around.  She is eating well.  Blood sugar was 158 which required 2 units coverage.  Reminded her that it is very important to eat a balanced meal.  Encouraged continued participation in group and unit activities.  Close Observation sitter remains for safety.  We will continue to monitor the progress towards her goals.

## 2015-07-10 NOTE — Progress Notes (Signed)
[facility] MD Progress Note  07/10/2015 2:56 PM Stacie Adams  MRN:  [id number] Subjective: Pt states " I am still paranoid, I think the police are going to get me, I keep thinking about this. I also have a lot of racing thoughts.I still hear AH."    Objective: Stacie Adams is a 55 y.o. AA female , who has a hx of Bipolar disorder, is deaf, lives with her daughter in [place], who presented to the emergency department at [facility] with thoughts of self harm, with plan. Pt seen today with ASL interpreter . Pt today continues to be anxious , paranoid as well as has AH. Pt is paranoid - around other peers as well as is delusional that the police are going to get her. Pt states she can see herself being in handcuffs. Pt reports she keeps thinking about her old life and has the urge to become like that again. Pt reports hx of abusing substances like cocaine, alcohol in the past. Pt reports she is so anxious that she keeps thinking about using again. Pt also reported trouble swallowing , then went on to say she has a good appetite and that how much ever she eats she still feels hungry . Pt also made delusional comments about reading the Bible and quoting a pasage from the Bible that states "No food, No life.". Its likely that her trouble with swallowing which is subjective ( not observed by staff) is anxiety induced. Pt continues to be paranoid and anxious about being on 500 unit and states she wants to be transferred to [facility] hospital deaf unit where she felt more comfortable in the past. Pt provided reassurance. Per staff pt continues to need 1;1 precaution , however having a staff following her around is making her more paranoid. Hence will change to continuous OBS.     Principal Problem: Bipolar disorder, curr episode depressed, severe, w/psychotic features (HCC) Diagnosis:   Patient Active Problem List   Diagnosis Date Noted  . Bipolar disorder, curr episode depressed, severe, w/psychotic features  (HCC) [F31.5] 07/09/2015  . Diabetes mellitus (HCC) [E11.9] 07/09/2015  . Alcohol use disorder, moderate, in sustained remission (HCC) [F10.21] 07/09/2015  . Cocaine use disorder, moderate, in sustained remission [F14.90] 07/09/2015  . Cannabis use disorder, moderate, in sustained remission [F12.90] 07/09/2015  . Neck pain [M54.2] 05/24/2015  . Hyperlipidemia [E78.5] 07/05/2014  . Deaf [H91.90] 05/22/2014  . Hypertension [I10]    Total Time spent with patient: 30 minutes  Past Psychiatric History: Pt has a hx of Bipolar disorder, has had several admissions to inpatient facilities since her twenties. Pt has had two suicide attempts in the past. Pt follows up with [facility].  Past Medical History:  Past Medical History  Diagnosis Date  . Diabetes mellitus without complication (HCC)   . Bipolar disorder (HCC)   . Hypertension   Family History:  Family History  Problem Relation Age of Onset  . Hypertension Mother   . Diabetes Mother   . Hypertension Father   . Diabetes Father   . Cancer Sister   . Cancer Brother   . Diabetes Other    Family Psychiatric  History: Pt denies hx of mental illness in her family. Social History: Pt lives with daughter in [place]. Pt is on SSD.  History  Alcohol Use No    Comment: rare     History  Drug Use No    Social History   Social History  . Marital Status: Legally Separated  Spouse Name: N/A  . Number of Children: 2  . Years of Education: 14   Occupational History  . Disability    Social History Main Topics  . Smoking status: Former Smoker -- 1.00 packs/day for 2 years    Quit date: 07/05/2004  . Smokeless tobacco: Never Used  . Alcohol Use: No     Comment: rare  . Drug Use: No  . Sexual Activity: Not Asked   Other Topics Concern  . None   Social History Narrative   Born and raised in [place], [place]. Currently resides in a house with her daughter and grandson. 1 dog. Fun: Shopping, partying    Denies any religious beliefs that  would effect health c   Additional Social History:    Pain Medications: none Prescriptions: none Over the Counter: none History of alcohol / drug use?: No history of alcohol / drug abuse                    Sleep: Fair  Appetite:  Fair  Current Medications: Current Facility-Administered Medications  Medication Dose Route Frequency Provider Last Rate Last Dose  . acetaminophen (TYLENOL) tablet 650 mg  650 mg Oral Q6H PRN Charm Rings, NP      . alum & mag hydroxide-simeth (MAALOX/MYLANTA) 200-200-20 MG/5ML suspension 30 mL  30 mL Oral Q4H PRN Charm Rings, NP   30 mL at 07/09/15 1334  . ARIPiprazole (ABILIFY) tablet 5 mg  5 mg Oral BID Jomarie Longs, MD      . benztropine (COGENTIN) tablet 0.5 mg  0.5 mg Oral BID Jomarie Longs, MD   0.5 mg at 07/10/15 0818  . [START ON 07/11/2015] buPROPion (WELLBUTRIN) tablet 75 mg  75 mg Oral Daily Stacie Messmer, MD      . clonazePAM (KLONOPIN) tablet 0.25 mg  0.25 mg Oral TID Jomarie Longs, MD      . cyclobenzaprine (FLEXERIL) tablet 5 mg  5 mg Oral TID PRN Beau Fanny, FNP      . divalproex (DEPAKOTE) DR tablet 750 mg  750 mg Oral BH-qamhs Stacie Rawlinson, MD   750 mg at 07/10/15 0817  . feeding supplement (GLUCERNA SHAKE) (GLUCERNA SHAKE) liquid 237 mL  237 mL Oral TID BM Stacie Zane, MD   237 mL at 07/10/15 1452  . insulin aspart (novoLOG) injection 0-5 Units  0-5 Units Subcutaneous QHS Stacie Brenner, MD      . insulin aspart (novoLOG) injection 0-9 Units  0-9 Units Subcutaneous TID WC Stacie Meddaugh, MD      . insulin aspart protamine- aspart (NOVOLOG MIX 70/30) injection 16 Units  16 Units Subcutaneous BID WC Stacie Bacha, MD      . lisinopril (PRINIVIL,ZESTRIL) tablet 20 mg  20 mg Oral q morning - 10a Charm Rings, NP   20 mg at 07/10/15 1010  . magnesium hydroxide (MILK OF MAGNESIA) suspension 30 mL  30 mL Oral Daily PRN Charm Rings, NP      . metFORMIN (GLUCOPHAGE) tablet 500 mg  500 mg Oral BID WC Charm Rings, NP    500 mg at 07/10/15 0818  . neomycin-bacitracin-polymyxin (NEOSPORIN) ointment   Topical BID PRN Stacie Rack, NP      . OLANZapine zydis (ZYPREXA) disintegrating tablet 5 mg  5 mg Oral Q6H PRN Jomarie Longs, MD      . traZODone (DESYREL) tablet 100 mg  100 mg Oral QHS Jomarie Longs, MD  Lab Results:  Results for orders placed or performed during the hospital encounter of 07/08/15 (from the past 48 hour(s))  Glucose, capillary     Status: Abnormal   Collection Time: 07/08/15  4:44 PM  Result Value Ref Range   Glucose-Capillary 128 (H) 65 - 99 mg/dL  Glucose, capillary     Status: Abnormal   Collection Time: 07/08/15  9:16 PM  Result Value Ref Range   Glucose-Capillary 125 (H) 65 - 99 mg/dL  Glucose, capillary     Status: None   Collection Time: 07/09/15  6:37 AM  Result Value Ref Range   Glucose-Capillary 84 65 - 99 mg/dL  Hemoglobin Z6X     Status: Abnormal   Collection Time: 07/09/15  6:50 AM  Result Value Ref Range   Hgb A1c MFr Bld 8.2 (H) 4.8 - 5.6 %    Comment: (NOTE)         Pre-diabetes: 5.7 - 6.4         Diabetes: >6.4         Glycemic control for adults with diabetes: <7.0    Mean Plasma Glucose 189 mg/dL    Comment: (NOTE) Performed At: [facility] [address] [place], [place] [id number] Mila Homer MD [id number] Performed at [facility]   Lipid panel     Status: None   Collection Time: 07/09/15  6:50 AM  Result Value Ref Range   Cholesterol 131 0 - 200 mg/dL   Triglycerides 55 <213 mg/dL   HDL 50 >08 mg/dL   Total CHOL/HDL Ratio 2.6 RATIO   VLDL 11 0 - 40 mg/dL   LDL Cholesterol 70 0 - 99 mg/dL    Comment:        Total Cholesterol/HDL:CHD Risk Coronary Heart Disease Risk Table                     Men   Women  1/2 Average Risk   3.4   3.3  Average Risk       5.0   4.4  2 X Average Risk   9.6   7.1  3 X Average Risk  23.4   11.0        Use the calculated Patient Ratio above and the CHD Risk Table to  determine the patient's CHD Risk.        ATP III CLASSIFICATION (LDL):  <100     mg/dL   Optimal  657-846  mg/dL   Near or Above                    Optimal  130-159  mg/dL   Borderline  962-952  mg/dL   High  >841     mg/dL   Very High Performed at [facility]   TSH     Status: None   Collection Time: 07/09/15  6:50 AM  Result Value Ref Range   TSH 1.177 0.350 - 4.500 uIU/mL    Comment: Performed at [facility]  Glucose, capillary     Status: None   Collection Time: 07/09/15 11:26 AM  Result Value Ref Range   Glucose-Capillary 75 65 - 99 mg/dL   Comment 1 Notify RN    Comment 2 Document in Chart   Glucose, capillary     Status: Abnormal   Collection Time: 07/09/15  4:20 PM  Result Value Ref Range   Glucose-Capillary 186 (H) 65 - 99 mg/dL   Comment  1 Notify RN    Comment 2 Document in Chart   Glucose, capillary     Status: Abnormal   Collection Time: 07/09/15  8:40 PM  Result Value Ref Range   Glucose-Capillary 56 (L) 65 - 99 mg/dL  Glucose, capillary     Status: None   Collection Time: 07/09/15  8:59 PM  Result Value Ref Range   Glucose-Capillary 83 65 - 99 mg/dL  Glucose, capillary     Status: None   Collection Time: 07/10/15  6:19 AM  Result Value Ref Range   Glucose-Capillary 88 65 - 99 mg/dL  Glucose, capillary     Status: Abnormal   Collection Time: 07/10/15 12:07 PM  Result Value Ref Range   Glucose-Capillary 62 (L) 65 - 99 mg/dL    Physical Findings: AIMS:  , ,  ,  ,    CIWA:    COWS:     Musculoskeletal: Strength & Muscle Tone: within normal limits Gait & Station: normal Patient leans: N/A  Psychiatric Specialty Exam: Review of Systems  Psychiatric/Behavioral: Positive for depression and hallucinations. The patient is nervous/anxious.   All other systems reviewed and are negative.   Blood pressure 120/80, pulse 102, temperature 97.8 F (36.6 C), temperature source Oral, resp. rate 16, height  (1.626 m), weight  77.111 kg (170 lb), SpO2 100 %.Body mass index is 29.17 kg/(m^2).  General Appearance: Disheveled  Eye Solicitor::  Fair  Speech:  is deaf - communicates through ASL  Volume:  is deaf  Mood:  Anxious and Depressed  Affect:  Labile  Thought Process:  Irrelevant  Orientation:  Full (Time, Place, and Person)  Thought Content:  Delusions, Hallucinations: Auditory Visual, Paranoid Ideation and Rumination  Suicidal Thoughts:  No But is paranoid which makes her a danger to self or others  Homicidal Thoughts:  No  Memory:  Immediate;   Fair Recent;   Fair Remote;   Fair  Judgement:  Impaired  Insight:  Shallow  Psychomotor Activity:  Restlessness  Concentration:  Poor  Recall:  [employer] of Knowledge:Fair  Language: Fair  Akathisia:  No  Handed:  Right  AIMS (if indicated):     Assets:  Desire for Improvement  ADL's:  Intact  Cognition: WNL  Sleep:  Number of Hours: 5.75   Treatment Plan Summary: Stacie Adams is a 55 y.o. AA female , who has a hx of Bipolar disorder,is deaf , who presents with worsening depression, anxiety and paranoia.Pt continues to be anxious, paranoid. Will readjust medications and continue inpatient stay. Daily contact with patient to assess and evaluate symptoms and progress in treatment and Medication management   Reviewed past medical records,treatment plan. Will continue Depakote DR 750 mg po bid for mood sx. Depakote level in 5 days. Will change Wellbutrin from qhs to daily and reduce dose - with plan to taper off - Pt is having trouble with sleep and is also anxious. Wellbutrin can make her sx worse. Will increase Abilify to 5 mg po bid for mood sx/psychosis. Will continue Cogentin 0.5 mg po bid for EPS. Will add Klonopin 0.25 mg po tid for anxiety sx. Will increase Trazodone 100 mg po qhs for sleep. Will make available PRN medications as per agitation protocol. Change 1:1 precaution to closed OBS for safety/paranoia being on the unit.   Will  continue to monitor vitals ,medication compliance and treatment side effects while patient is here.  Will monitor for medical issues as well as call consult  as needed.  Diabetic consult placed - management as per recommendations - please see consult notes. Reviewed labs ,TSH - wnl, lipid panel- wnl, PL pending, Hba1c- 8.2 .  Depakote level on 07/11/15. CSW will start working on disposition.  Patient to participate in therapeutic milieu .       Luay Balding MD 07/10/2015, 2:56 PM

## 2015-07-10 NOTE — [facility] Suicide Risk Assessment (Signed)
[facility] INPATIENT:  Family/Significant Other Suicide Prevention Education  Suicide Prevention Education:  Patient Discharged to Other Healthcare Facility:  Suicide Prevention Education Not Provided: Pt transferred to [facility].  The patient is discharging to another healthcare facility for continuation of treatment.  The patient's medical information, including suicide ideations and risk factors, are a part of the medical information shared with the receiving healthcare facility.  Daryel Gerald B 07/10/2015, 3:52 PM

## 2015-07-10 NOTE — Progress Notes (Signed)
Recreation Therapy Notes  01.31.2017  LRT discussed patient appropriateness for pet therapy session, MD & LRT agree patient is appropriate. Patient offered opportunity to participate in pet therapy session, patient declined. Significant communication barrier present during conversation as patient interpreter had already left for the afternoon. LRT wrote down questions for patient to read.. Patient read everything and nodded "yes" and then wrote in response, "I'm fear." LRT inquired why, patient pointed to her head. LRT agreed to return to work with patient when interpreter is present. Patient agreeable.   Marykay Lex Lc Joynt, LRT/CTRS  Stacie Adams 07/10/2015 3:19 PM

## 2015-07-11 LAB — PROLACTIN: Prolactin: 6.2 ng/mL (ref 4.8–23.3)

## 2015-07-12 ENCOUNTER — Ambulatory Visit: Payer: Medicare Other

## 2015-07-31 DIAGNOSIS — E871 Hypo-osmolality and hyponatremia: Secondary | ICD-10-CM | POA: Diagnosis not present

## 2015-07-31 DIAGNOSIS — K5900 Constipation, unspecified: Secondary | ICD-10-CM | POA: Diagnosis not present

## 2015-07-31 DIAGNOSIS — K5669 Other intestinal obstruction: Secondary | ICD-10-CM | POA: Diagnosis not present

## 2015-07-31 DIAGNOSIS — R0902 Hypoxemia: Secondary | ICD-10-CM | POA: Diagnosis not present

## 2015-07-31 DIAGNOSIS — J9811 Atelectasis: Secondary | ICD-10-CM | POA: Diagnosis not present

## 2015-08-02 DIAGNOSIS — Z0389 Encounter for observation for other suspected diseases and conditions ruled out: Secondary | ICD-10-CM | POA: Diagnosis not present

## 2015-08-02 DIAGNOSIS — K5900 Constipation, unspecified: Secondary | ICD-10-CM | POA: Diagnosis not present

## 2015-08-02 DIAGNOSIS — K5660 Unspecified intestinal obstruction: Secondary | ICD-10-CM | POA: Diagnosis not present

## 2015-08-03 DIAGNOSIS — K5660 Unspecified intestinal obstruction: Secondary | ICD-10-CM | POA: Diagnosis not present

## 2015-08-03 DIAGNOSIS — K5900 Constipation, unspecified: Secondary | ICD-10-CM | POA: Diagnosis not present

## 2015-08-05 DIAGNOSIS — Z4682 Encounter for fitting and adjustment of non-vascular catheter: Secondary | ICD-10-CM | POA: Diagnosis not present

## 2015-08-19 ENCOUNTER — Other Ambulatory Visit: Payer: Self-pay | Admitting: Family

## 2015-09-26 DIAGNOSIS — R197 Diarrhea, unspecified: Secondary | ICD-10-CM | POA: Diagnosis not present

## 2015-09-26 DIAGNOSIS — Z1211 Encounter for screening for malignant neoplasm of colon: Secondary | ICD-10-CM | POA: Diagnosis not present

## 2015-10-09 DIAGNOSIS — E119 Type 2 diabetes mellitus without complications: Secondary | ICD-10-CM | POA: Diagnosis not present

## 2015-10-09 DIAGNOSIS — H109 Unspecified conjunctivitis: Secondary | ICD-10-CM | POA: Diagnosis not present

## 2015-10-09 DIAGNOSIS — H527 Unspecified disorder of refraction: Secondary | ICD-10-CM | POA: Diagnosis not present

## 2015-10-09 LAB — HM DIABETES EYE EXAM

## 2016-01-03 DIAGNOSIS — M13812 Other specified arthritis, left shoulder: Secondary | ICD-10-CM | POA: Diagnosis not present

## 2016-01-03 DIAGNOSIS — M25512 Pain in left shoulder: Secondary | ICD-10-CM | POA: Diagnosis not present

## 2016-04-09 DIAGNOSIS — K5909 Other constipation: Secondary | ICD-10-CM | POA: Diagnosis not present

## 2016-04-09 DIAGNOSIS — Z800 Family history of malignant neoplasm of digestive organs: Secondary | ICD-10-CM | POA: Diagnosis not present

## 2016-04-09 DIAGNOSIS — R970 Elevated carcinoembryonic antigen [CEA]: Secondary | ICD-10-CM | POA: Diagnosis not present

## 2016-04-09 DIAGNOSIS — R195 Other fecal abnormalities: Secondary | ICD-10-CM | POA: Diagnosis not present

## 2016-04-11 DIAGNOSIS — K769 Liver disease, unspecified: Secondary | ICD-10-CM | POA: Diagnosis not present

## 2016-04-27 IMAGING — CR DG CHEST 2V
2 series · 2 of 2 positions shown · non-contrast
Comparison: 10/04/2009

CLINICAL DATA: Nonproductive cough. Hypertension. Diabetes. Right
upper back pain.

EXAM:
CHEST  2 VIEW

[w chest pa]
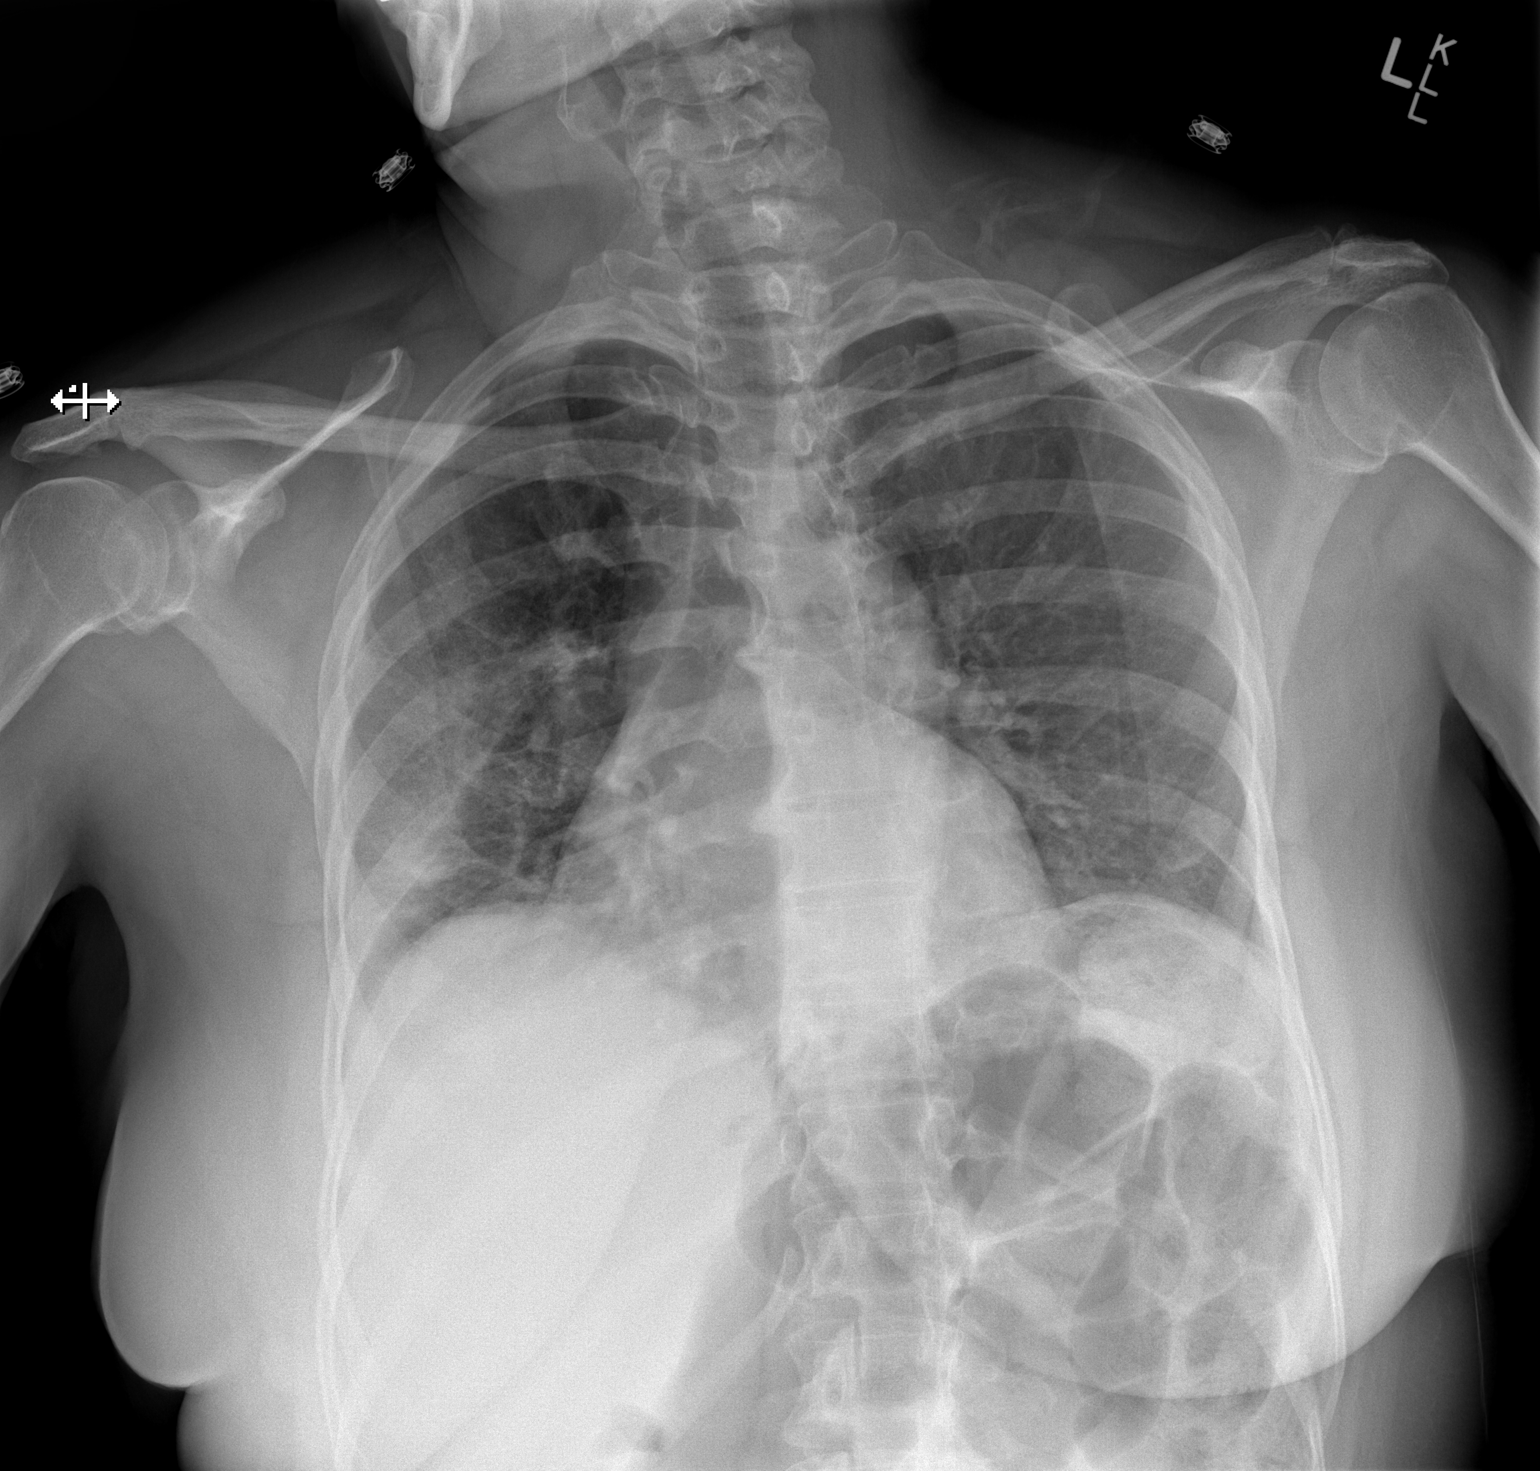

[w chest lat]
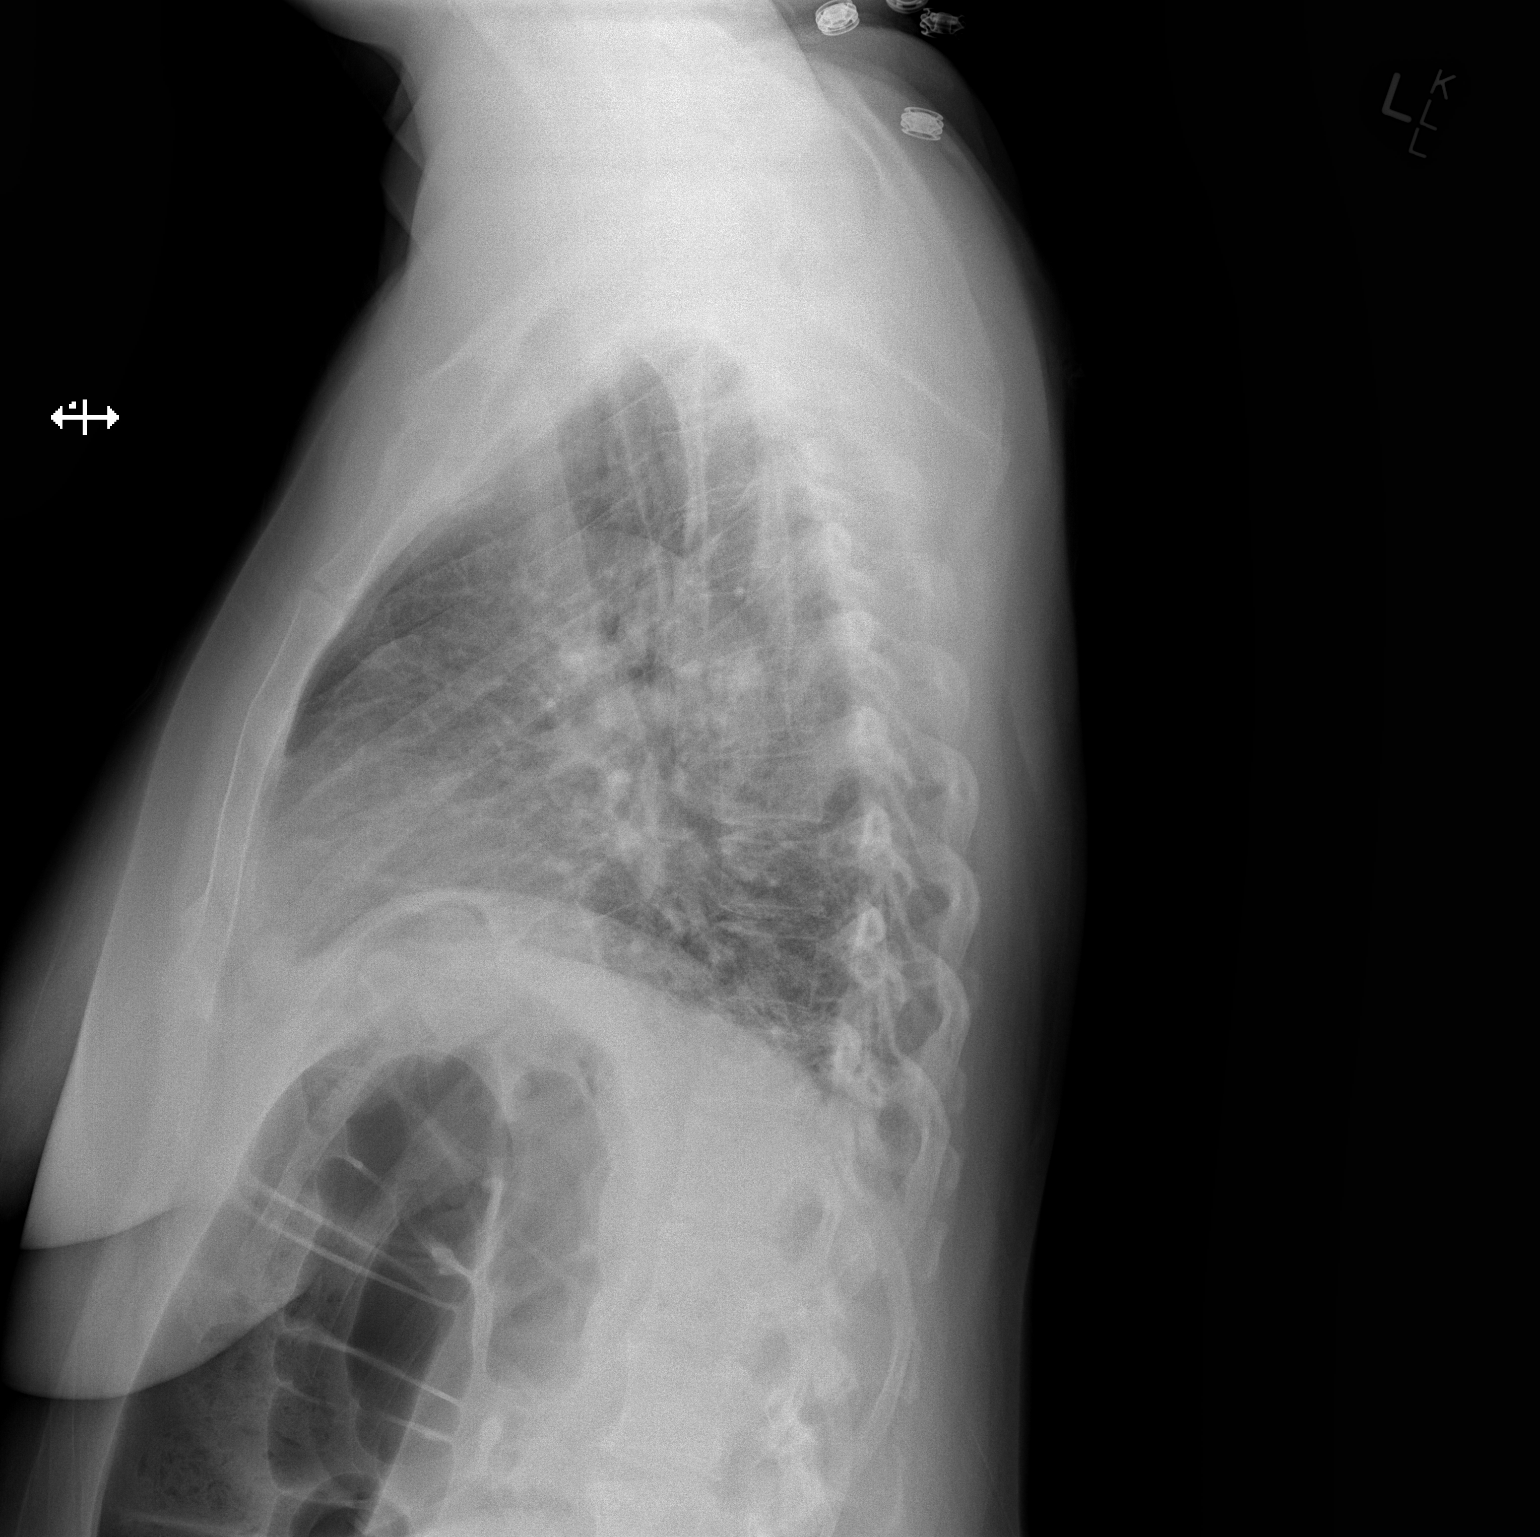

[2 of 2 positions shown; findings below may reference images not displayed]

FINDINGS: The patient is rotated to the Right on today's radiograph, reducing
diagnostic sensitivity and specificity. Indistinct airspace opacity
observed in the right lower lobe, suspicious for pneumonia.

Thoracic spondylosis. Cardiac and mediastinal margins appear normal.
No pleural effusion identified.
IMPRESSION: 1. Indistinct airspace opacity in the right lower lobe, suspicious
for pneumonia. Followup chest radiography is recommended in 4 weeks
time to ensure resolution and exclude underlying malignancy.

## 2016-04-27 IMAGING — CT CT ANGIO CHEST
1 of 2 series · 19 of 32 positions shown · IV contrast (OMNIPAQUE 350)
Comparison: 05/22/2014; 06/04/2004

CLINICAL DATA: Chest pain. Chest soreness. Dry cough. Elevated
D-dimer. Right back pain.

EXAM:
CT ANGIOGRAPHY CHEST WITH CONTRAST
TECHNIQUE: Multidetector CT imaging of the chest was performed using the
standard protocol during bolus administration of intravenous
contrast. Multiplanar CT image reconstructions and MIPs were
obtained to evaluate the vascular anatomy.
CONTRAST:  100mL OMNIPAQUE IOHEXOL 350 MG/ML SOLN

[Series 10: thins for pacs · axial · 0.74mm/px · z∈[+1322,+1539]mm · 19 of 243 slices shown]
[im 13/243  lung]
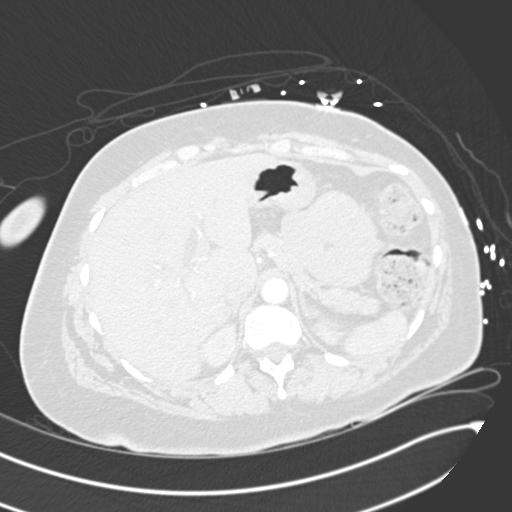
[im 25/243  mediastinal]
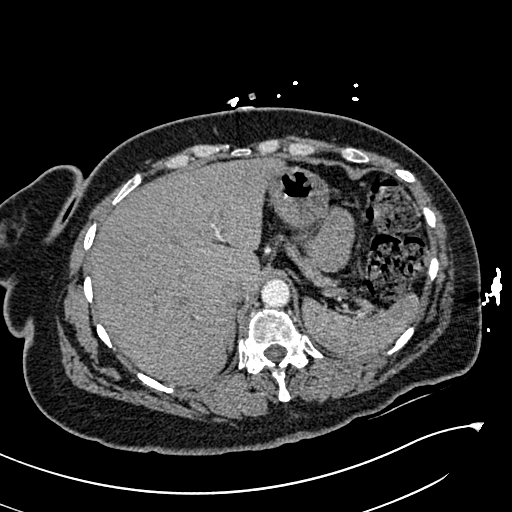
[im 37/243  lung]
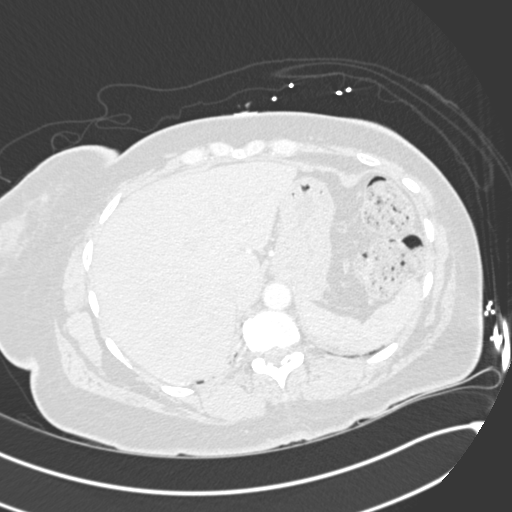
[im 61/243  mediastinal]
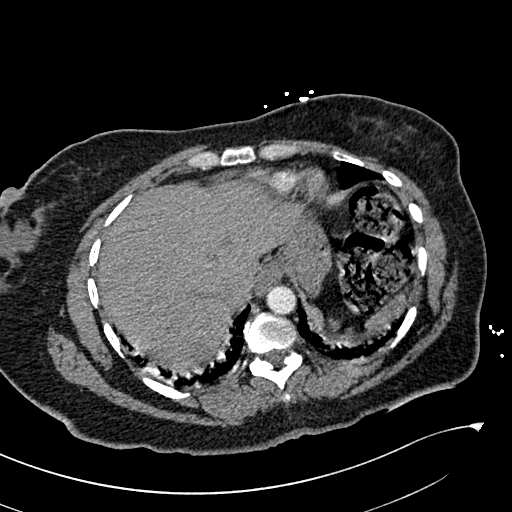
[im 73/243  lung]
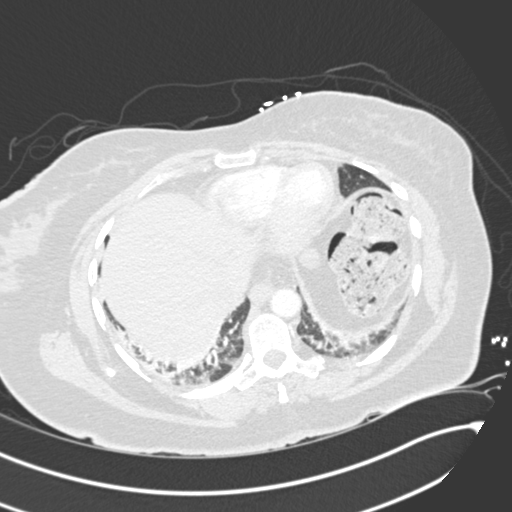
[im 81/243  mediastinal]
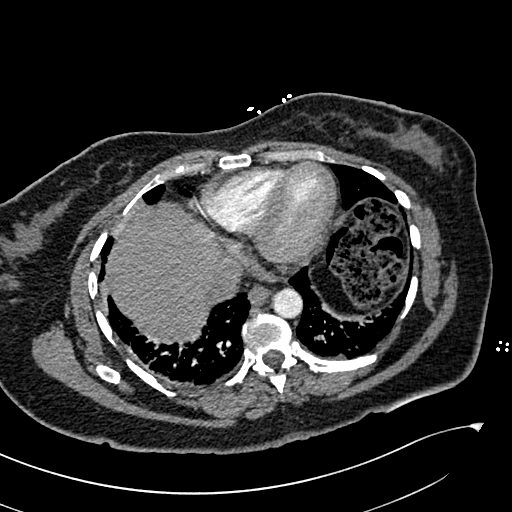
[im 85/243  lung]
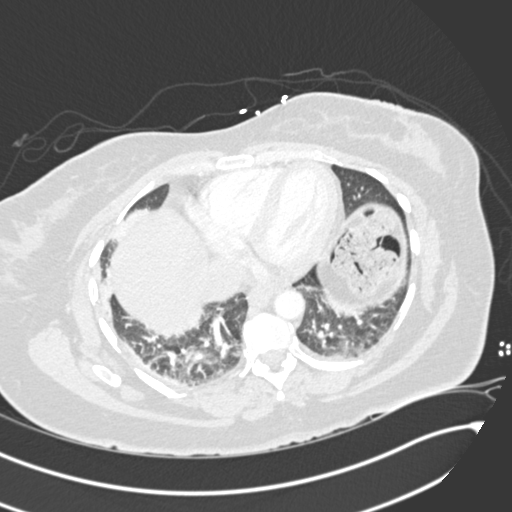
[im 97/243  mediastinal]
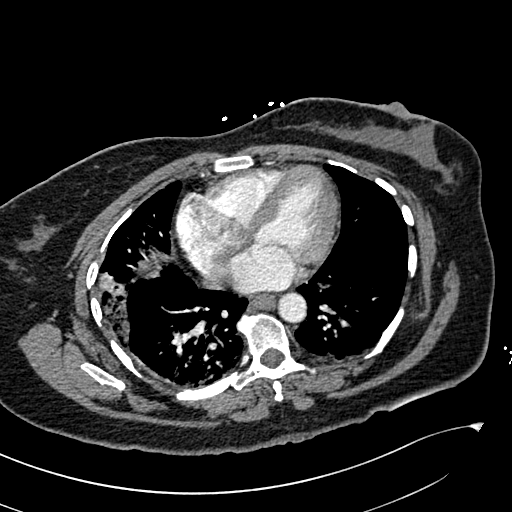
[im 109/243  lung]
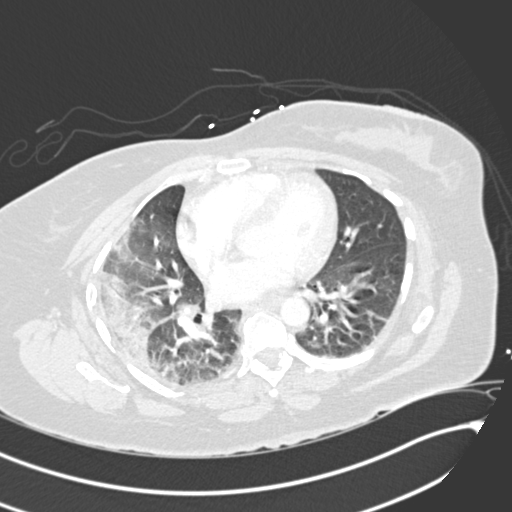
[im 122/243  mediastinal]
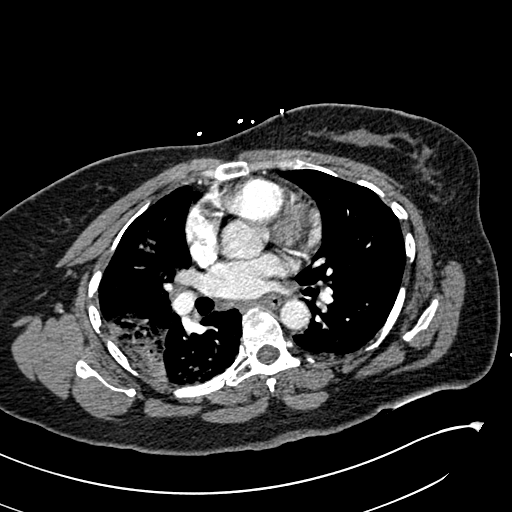
[im 134/243  lung]
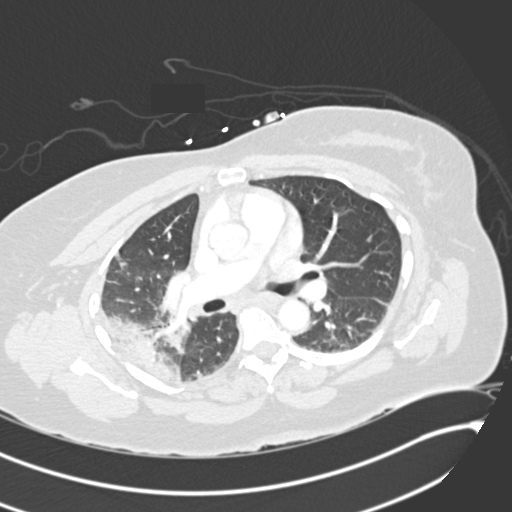
[im 146/243  mediastinal]
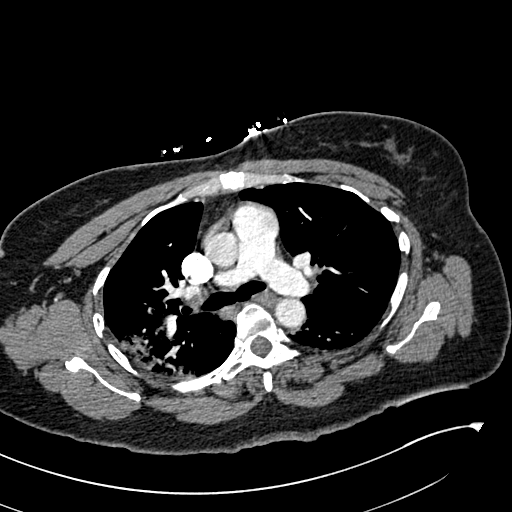
[im 158/243  lung]
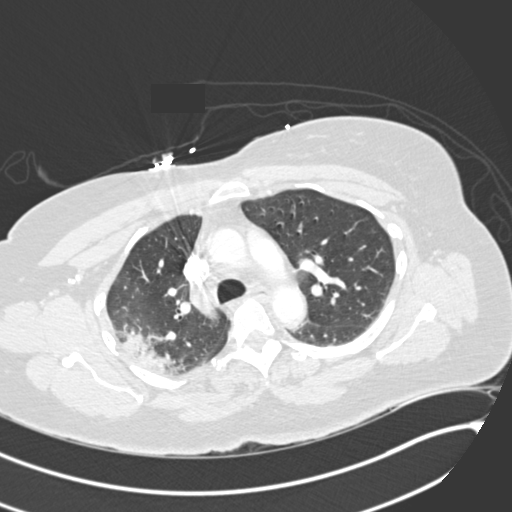
[im 162/243  mediastinal]
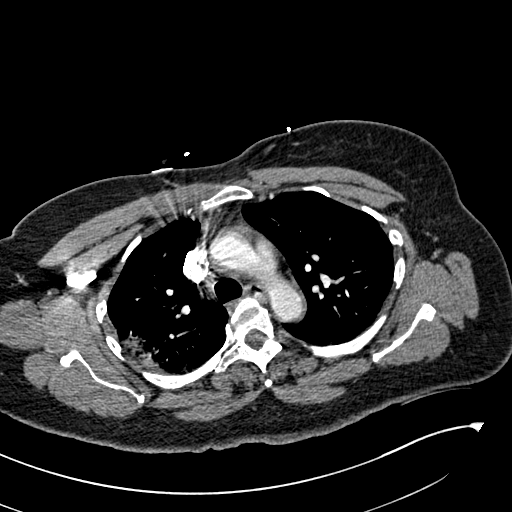
[im 170/243  lung]
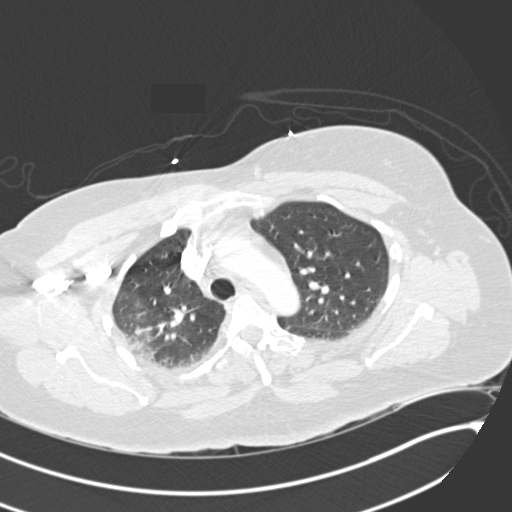
[im 182/243  mediastinal]
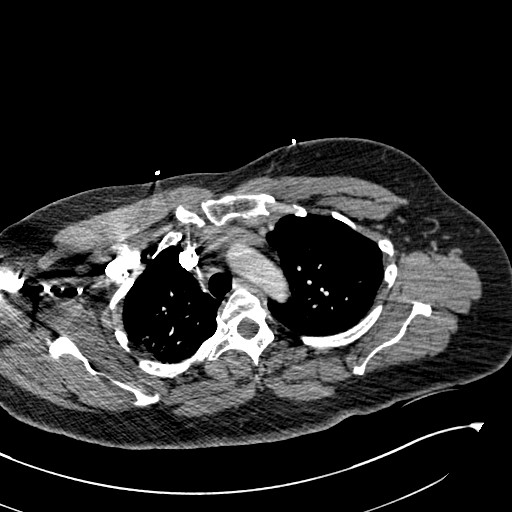
[im 206/243  lung]
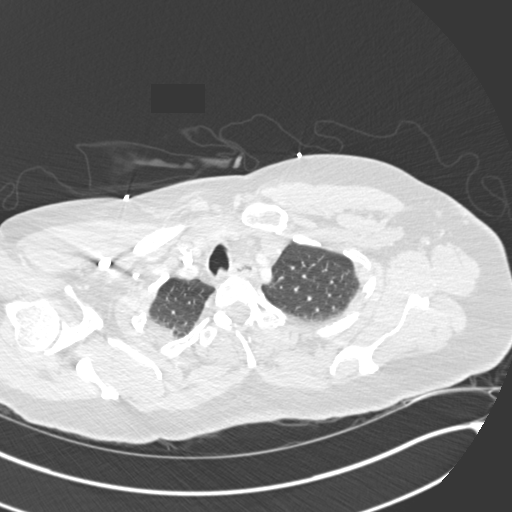
[im 218/243  mediastinal]
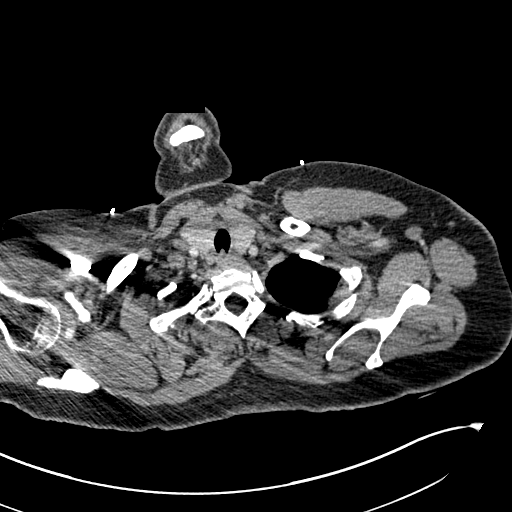
[im 230/243  lung]
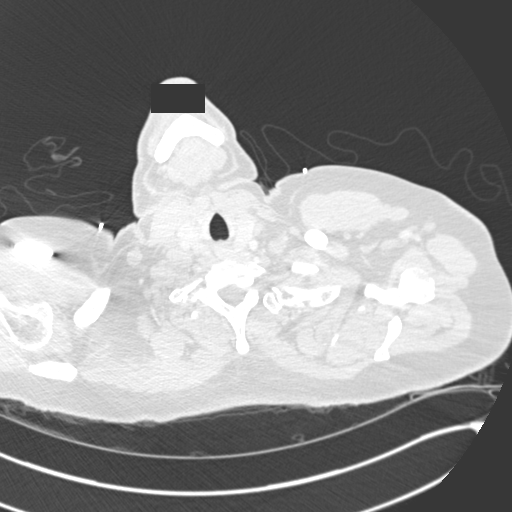

[19 of 32 positions shown; findings below may reference images not displayed]

FINDINGS: Suboptimally late contrast bolus and suboptimal breathing motion.
the exam would be much better if we repeated it.

Borderline enlargement of the cardiopericardial silhouette no acute
aortic findings are identified. Mild intrahepatic biliary
dilatation.

There is airspace opacity posteriorly in the right upper lobe. There
is likely some volume loss and airspace opacity in the right middle
lobe. Indistinct airspace opacity is present in both lower lobes
with some degree of volume loss in the lower lobes.

Review of the MIP images confirms the above findings.
IMPRESSION: 1. No embolus identified. Reduced negative predictive value due to
motion artifact and adequate but suboptimal contrast bolus.
2. Airspace opacities primarily in the right upper lobe but also
with patchy opacities in both lower lobes and the right middle lobe,
suspicious for multilobar pneumonia.

## 2016-05-06 DIAGNOSIS — K640 First degree hemorrhoids: Secondary | ICD-10-CM | POA: Diagnosis not present

## 2016-05-06 DIAGNOSIS — D649 Anemia, unspecified: Secondary | ICD-10-CM | POA: Diagnosis not present

## 2016-05-06 DIAGNOSIS — E119 Type 2 diabetes mellitus without complications: Secondary | ICD-10-CM | POA: Diagnosis not present

## 2016-05-06 DIAGNOSIS — Z1211 Encounter for screening for malignant neoplasm of colon: Secondary | ICD-10-CM | POA: Diagnosis not present

## 2016-05-06 DIAGNOSIS — K6389 Other specified diseases of intestine: Secondary | ICD-10-CM | POA: Diagnosis not present

## 2016-05-06 DIAGNOSIS — Z794 Long term (current) use of insulin: Secondary | ICD-10-CM | POA: Diagnosis not present

## 2016-05-12 DIAGNOSIS — K6389 Other specified diseases of intestine: Secondary | ICD-10-CM | POA: Diagnosis not present

## 2016-05-16 ENCOUNTER — Telehealth: Payer: Self-pay

## 2016-05-16 NOTE — Telephone Encounter (Signed)
Faxed request to Dr. Carollee HerterKelty office at [phone] to rq recent eye exam notes.

## 2016-05-22 ENCOUNTER — Encounter: Payer: Self-pay | Admitting: Family

## 2016-05-22 NOTE — Progress Notes (Signed)
Received fax of eye exam. Abstracted and sent to scan.

## 2016-05-22 NOTE — Telephone Encounter (Signed)
Notes received and abstracted.  

## 2016-06-05 DIAGNOSIS — R4182 Altered mental status, unspecified: Secondary | ICD-10-CM | POA: Diagnosis not present

## 2016-07-03 DIAGNOSIS — K5909 Other constipation: Secondary | ICD-10-CM | POA: Diagnosis not present

## 2016-07-03 DIAGNOSIS — R198 Other specified symptoms and signs involving the digestive system and abdomen: Secondary | ICD-10-CM | POA: Diagnosis not present

## 2016-07-03 DIAGNOSIS — K219 Gastro-esophageal reflux disease without esophagitis: Secondary | ICD-10-CM | POA: Diagnosis not present

## 2016-07-16 ENCOUNTER — Encounter: Payer: Self-pay | Admitting: Family

## 2016-07-29 DIAGNOSIS — K5900 Constipation, unspecified: Secondary | ICD-10-CM | POA: Diagnosis not present

## 2016-10-13 DIAGNOSIS — H524 Presbyopia: Secondary | ICD-10-CM | POA: Diagnosis not present

## 2016-10-13 DIAGNOSIS — E119 Type 2 diabetes mellitus without complications: Secondary | ICD-10-CM | POA: Diagnosis not present

## 2016-10-13 DIAGNOSIS — H40003 Preglaucoma, unspecified, bilateral: Secondary | ICD-10-CM | POA: Diagnosis not present

## 2017-01-14 DIAGNOSIS — Z8719 Personal history of other diseases of the digestive system: Secondary | ICD-10-CM | POA: Diagnosis not present

## 2017-01-14 DIAGNOSIS — R197 Diarrhea, unspecified: Secondary | ICD-10-CM | POA: Diagnosis not present

## 2017-01-14 DIAGNOSIS — K5901 Slow transit constipation: Secondary | ICD-10-CM | POA: Diagnosis not present

## 2017-01-14 DIAGNOSIS — K599 Functional intestinal disorder, unspecified: Secondary | ICD-10-CM | POA: Diagnosis not present

## 2017-01-30 DIAGNOSIS — K5900 Constipation, unspecified: Secondary | ICD-10-CM | POA: Diagnosis not present

## 2017-01-30 DIAGNOSIS — K5901 Slow transit constipation: Secondary | ICD-10-CM | POA: Diagnosis not present

## 2017-03-11 DIAGNOSIS — M6289 Other specified disorders of muscle: Secondary | ICD-10-CM | POA: Diagnosis not present

## 2017-03-11 DIAGNOSIS — K5900 Constipation, unspecified: Secondary | ICD-10-CM | POA: Diagnosis not present

## 2017-06-17 DIAGNOSIS — K5902 Outlet dysfunction constipation: Secondary | ICD-10-CM | POA: Diagnosis not present

## 2017-06-17 DIAGNOSIS — R159 Full incontinence of feces: Secondary | ICD-10-CM | POA: Diagnosis not present

## 2017-07-22 DIAGNOSIS — E785 Hyperlipidemia, unspecified: Secondary | ICD-10-CM | POA: Diagnosis not present

## 2017-07-22 DIAGNOSIS — E1165 Type 2 diabetes mellitus with hyperglycemia: Secondary | ICD-10-CM | POA: Diagnosis not present

## 2017-07-22 DIAGNOSIS — I10 Essential (primary) hypertension: Secondary | ICD-10-CM | POA: Diagnosis not present

## 2017-07-22 DIAGNOSIS — F319 Bipolar disorder, unspecified: Secondary | ICD-10-CM | POA: Diagnosis not present

## 2017-07-24 DIAGNOSIS — I10 Essential (primary) hypertension: Secondary | ICD-10-CM | POA: Diagnosis not present

## 2017-07-24 DIAGNOSIS — R419 Unspecified symptoms and signs involving cognitive functions and awareness: Secondary | ICD-10-CM | POA: Diagnosis not present

## 2017-07-24 DIAGNOSIS — K5902 Outlet dysfunction constipation: Secondary | ICD-10-CM | POA: Diagnosis not present

## 2017-07-24 DIAGNOSIS — R278 Other lack of coordination: Secondary | ICD-10-CM | POA: Diagnosis not present

## 2017-07-24 DIAGNOSIS — R350 Frequency of micturition: Secondary | ICD-10-CM | POA: Diagnosis not present

## 2017-07-24 DIAGNOSIS — R351 Nocturia: Secondary | ICD-10-CM | POA: Diagnosis not present

## 2017-07-24 DIAGNOSIS — M6289 Other specified disorders of muscle: Secondary | ICD-10-CM | POA: Diagnosis not present

## 2017-07-24 DIAGNOSIS — E1139 Type 2 diabetes mellitus with other diabetic ophthalmic complication: Secondary | ICD-10-CM | POA: Diagnosis not present

## 2017-07-24 DIAGNOSIS — F319 Bipolar disorder, unspecified: Secondary | ICD-10-CM | POA: Diagnosis not present

## 2017-07-24 DIAGNOSIS — H42 Glaucoma in diseases classified elsewhere: Secondary | ICD-10-CM | POA: Diagnosis not present

## 2017-07-24 DIAGNOSIS — E669 Obesity, unspecified: Secondary | ICD-10-CM | POA: Diagnosis not present

## 2017-08-04 DIAGNOSIS — E119 Type 2 diabetes mellitus without complications: Secondary | ICD-10-CM | POA: Diagnosis not present

## 2017-08-04 DIAGNOSIS — Z7984 Long term (current) use of oral hypoglycemic drugs: Secondary | ICD-10-CM | POA: Diagnosis not present

## 2017-08-05 DIAGNOSIS — H42 Glaucoma in diseases classified elsewhere: Secondary | ICD-10-CM | POA: Diagnosis not present

## 2017-08-05 DIAGNOSIS — K5902 Outlet dysfunction constipation: Secondary | ICD-10-CM | POA: Diagnosis not present

## 2017-08-05 DIAGNOSIS — E1139 Type 2 diabetes mellitus with other diabetic ophthalmic complication: Secondary | ICD-10-CM | POA: Diagnosis not present

## 2017-08-05 DIAGNOSIS — M6289 Other specified disorders of muscle: Secondary | ICD-10-CM | POA: Diagnosis not present

## 2017-08-05 DIAGNOSIS — I10 Essential (primary) hypertension: Secondary | ICD-10-CM | POA: Diagnosis not present

## 2017-08-05 DIAGNOSIS — F319 Bipolar disorder, unspecified: Secondary | ICD-10-CM | POA: Diagnosis not present

## 2017-08-12 DIAGNOSIS — H42 Glaucoma in diseases classified elsewhere: Secondary | ICD-10-CM | POA: Diagnosis not present

## 2017-08-12 DIAGNOSIS — R278 Other lack of coordination: Secondary | ICD-10-CM | POA: Diagnosis not present

## 2017-08-12 DIAGNOSIS — M6289 Other specified disorders of muscle: Secondary | ICD-10-CM | POA: Diagnosis not present

## 2017-08-12 DIAGNOSIS — E669 Obesity, unspecified: Secondary | ICD-10-CM | POA: Diagnosis not present

## 2017-08-12 DIAGNOSIS — F319 Bipolar disorder, unspecified: Secondary | ICD-10-CM | POA: Diagnosis not present

## 2017-08-12 DIAGNOSIS — E1139 Type 2 diabetes mellitus with other diabetic ophthalmic complication: Secondary | ICD-10-CM | POA: Diagnosis not present

## 2017-08-12 DIAGNOSIS — I10 Essential (primary) hypertension: Secondary | ICD-10-CM | POA: Diagnosis not present

## 2017-08-12 DIAGNOSIS — K5902 Outlet dysfunction constipation: Secondary | ICD-10-CM | POA: Diagnosis not present

## 2017-08-12 DIAGNOSIS — R351 Nocturia: Secondary | ICD-10-CM | POA: Diagnosis not present

## 2017-08-12 DIAGNOSIS — R350 Frequency of micturition: Secondary | ICD-10-CM | POA: Diagnosis not present

## 2017-08-12 DIAGNOSIS — R419 Unspecified symptoms and signs involving cognitive functions and awareness: Secondary | ICD-10-CM | POA: Diagnosis not present

## 2017-08-19 DIAGNOSIS — E1139 Type 2 diabetes mellitus with other diabetic ophthalmic complication: Secondary | ICD-10-CM | POA: Diagnosis not present

## 2017-08-19 DIAGNOSIS — I10 Essential (primary) hypertension: Secondary | ICD-10-CM | POA: Diagnosis not present

## 2017-08-19 DIAGNOSIS — H42 Glaucoma in diseases classified elsewhere: Secondary | ICD-10-CM | POA: Diagnosis not present

## 2017-08-19 DIAGNOSIS — F319 Bipolar disorder, unspecified: Secondary | ICD-10-CM | POA: Diagnosis not present

## 2017-08-19 DIAGNOSIS — M6289 Other specified disorders of muscle: Secondary | ICD-10-CM | POA: Diagnosis not present

## 2017-08-19 DIAGNOSIS — K5902 Outlet dysfunction constipation: Secondary | ICD-10-CM | POA: Diagnosis not present

## 2017-08-25 DIAGNOSIS — H42 Glaucoma in diseases classified elsewhere: Secondary | ICD-10-CM | POA: Diagnosis not present

## 2017-08-25 DIAGNOSIS — M6289 Other specified disorders of muscle: Secondary | ICD-10-CM | POA: Diagnosis not present

## 2017-08-25 DIAGNOSIS — I10 Essential (primary) hypertension: Secondary | ICD-10-CM | POA: Diagnosis not present

## 2017-08-25 DIAGNOSIS — E1139 Type 2 diabetes mellitus with other diabetic ophthalmic complication: Secondary | ICD-10-CM | POA: Diagnosis not present

## 2017-08-25 DIAGNOSIS — F319 Bipolar disorder, unspecified: Secondary | ICD-10-CM | POA: Diagnosis not present

## 2017-08-25 DIAGNOSIS — K5902 Outlet dysfunction constipation: Secondary | ICD-10-CM | POA: Diagnosis not present

## 2017-08-26 DIAGNOSIS — E1165 Type 2 diabetes mellitus with hyperglycemia: Secondary | ICD-10-CM | POA: Diagnosis not present

## 2017-09-09 DIAGNOSIS — F319 Bipolar disorder, unspecified: Secondary | ICD-10-CM | POA: Diagnosis not present

## 2017-09-09 DIAGNOSIS — R278 Other lack of coordination: Secondary | ICD-10-CM | POA: Diagnosis not present

## 2017-09-09 DIAGNOSIS — R350 Frequency of micturition: Secondary | ICD-10-CM | POA: Diagnosis not present

## 2017-09-09 DIAGNOSIS — M6289 Other specified disorders of muscle: Secondary | ICD-10-CM | POA: Diagnosis not present

## 2017-09-09 DIAGNOSIS — R419 Unspecified symptoms and signs involving cognitive functions and awareness: Secondary | ICD-10-CM | POA: Diagnosis not present

## 2017-09-09 DIAGNOSIS — I10 Essential (primary) hypertension: Secondary | ICD-10-CM | POA: Diagnosis not present

## 2017-09-09 DIAGNOSIS — K5902 Outlet dysfunction constipation: Secondary | ICD-10-CM | POA: Diagnosis not present

## 2017-09-09 DIAGNOSIS — H42 Glaucoma in diseases classified elsewhere: Secondary | ICD-10-CM | POA: Diagnosis not present

## 2017-09-09 DIAGNOSIS — E669 Obesity, unspecified: Secondary | ICD-10-CM | POA: Diagnosis not present

## 2017-09-09 DIAGNOSIS — E1139 Type 2 diabetes mellitus with other diabetic ophthalmic complication: Secondary | ICD-10-CM | POA: Diagnosis not present

## 2017-09-09 DIAGNOSIS — R351 Nocturia: Secondary | ICD-10-CM | POA: Diagnosis not present

## 2017-09-16 DIAGNOSIS — E1139 Type 2 diabetes mellitus with other diabetic ophthalmic complication: Secondary | ICD-10-CM | POA: Diagnosis not present

## 2017-09-16 DIAGNOSIS — H42 Glaucoma in diseases classified elsewhere: Secondary | ICD-10-CM | POA: Diagnosis not present

## 2017-09-16 DIAGNOSIS — F319 Bipolar disorder, unspecified: Secondary | ICD-10-CM | POA: Diagnosis not present

## 2017-09-16 DIAGNOSIS — M6289 Other specified disorders of muscle: Secondary | ICD-10-CM | POA: Diagnosis not present

## 2017-09-16 DIAGNOSIS — K5902 Outlet dysfunction constipation: Secondary | ICD-10-CM | POA: Diagnosis not present

## 2017-09-16 DIAGNOSIS — I10 Essential (primary) hypertension: Secondary | ICD-10-CM | POA: Diagnosis not present

## 2017-09-22 DIAGNOSIS — F319 Bipolar disorder, unspecified: Secondary | ICD-10-CM | POA: Diagnosis not present

## 2017-09-22 DIAGNOSIS — M6289 Other specified disorders of muscle: Secondary | ICD-10-CM | POA: Diagnosis not present

## 2017-09-22 DIAGNOSIS — E1139 Type 2 diabetes mellitus with other diabetic ophthalmic complication: Secondary | ICD-10-CM | POA: Diagnosis not present

## 2017-09-22 DIAGNOSIS — H42 Glaucoma in diseases classified elsewhere: Secondary | ICD-10-CM | POA: Diagnosis not present

## 2017-09-22 DIAGNOSIS — I10 Essential (primary) hypertension: Secondary | ICD-10-CM | POA: Diagnosis not present

## 2017-09-22 DIAGNOSIS — K5902 Outlet dysfunction constipation: Secondary | ICD-10-CM | POA: Diagnosis not present

## 2017-09-30 DIAGNOSIS — E1139 Type 2 diabetes mellitus with other diabetic ophthalmic complication: Secondary | ICD-10-CM | POA: Diagnosis not present

## 2017-09-30 DIAGNOSIS — F319 Bipolar disorder, unspecified: Secondary | ICD-10-CM | POA: Diagnosis not present

## 2017-09-30 DIAGNOSIS — K5902 Outlet dysfunction constipation: Secondary | ICD-10-CM | POA: Diagnosis not present

## 2017-09-30 DIAGNOSIS — H42 Glaucoma in diseases classified elsewhere: Secondary | ICD-10-CM | POA: Diagnosis not present

## 2017-09-30 DIAGNOSIS — I10 Essential (primary) hypertension: Secondary | ICD-10-CM | POA: Diagnosis not present

## 2017-09-30 DIAGNOSIS — M6289 Other specified disorders of muscle: Secondary | ICD-10-CM | POA: Diagnosis not present

## 2017-10-07 DIAGNOSIS — K5902 Outlet dysfunction constipation: Secondary | ICD-10-CM | POA: Diagnosis not present

## 2017-10-07 DIAGNOSIS — K5901 Slow transit constipation: Secondary | ICD-10-CM | POA: Diagnosis not present

## 2017-10-09 DIAGNOSIS — R351 Nocturia: Secondary | ICD-10-CM | POA: Diagnosis not present

## 2017-10-09 DIAGNOSIS — K5902 Outlet dysfunction constipation: Secondary | ICD-10-CM | POA: Diagnosis not present

## 2017-10-09 DIAGNOSIS — E669 Obesity, unspecified: Secondary | ICD-10-CM | POA: Diagnosis not present

## 2017-10-09 DIAGNOSIS — R350 Frequency of micturition: Secondary | ICD-10-CM | POA: Diagnosis not present

## 2017-10-09 DIAGNOSIS — R419 Unspecified symptoms and signs involving cognitive functions and awareness: Secondary | ICD-10-CM | POA: Diagnosis not present

## 2017-10-09 DIAGNOSIS — H42 Glaucoma in diseases classified elsewhere: Secondary | ICD-10-CM | POA: Diagnosis not present

## 2017-10-09 DIAGNOSIS — I10 Essential (primary) hypertension: Secondary | ICD-10-CM | POA: Diagnosis not present

## 2017-10-09 DIAGNOSIS — M6289 Other specified disorders of muscle: Secondary | ICD-10-CM | POA: Diagnosis not present

## 2017-10-09 DIAGNOSIS — R278 Other lack of coordination: Secondary | ICD-10-CM | POA: Diagnosis not present

## 2017-10-09 DIAGNOSIS — E1139 Type 2 diabetes mellitus with other diabetic ophthalmic complication: Secondary | ICD-10-CM | POA: Diagnosis not present

## 2017-10-20 DIAGNOSIS — E785 Hyperlipidemia, unspecified: Secondary | ICD-10-CM | POA: Diagnosis not present

## 2017-10-20 DIAGNOSIS — E1165 Type 2 diabetes mellitus with hyperglycemia: Secondary | ICD-10-CM | POA: Diagnosis not present

## 2017-10-20 DIAGNOSIS — I10 Essential (primary) hypertension: Secondary | ICD-10-CM | POA: Diagnosis not present

## 2017-11-05 DIAGNOSIS — Z6830 Body mass index (BMI) 30.0-30.9, adult: Secondary | ICD-10-CM | POA: Diagnosis not present

## 2017-11-05 DIAGNOSIS — H409 Unspecified glaucoma: Secondary | ICD-10-CM | POA: Diagnosis not present

## 2017-11-05 DIAGNOSIS — K5909 Other constipation: Secondary | ICD-10-CM | POA: Diagnosis not present

## 2017-11-05 DIAGNOSIS — E119 Type 2 diabetes mellitus without complications: Secondary | ICD-10-CM | POA: Diagnosis not present

## 2017-11-05 DIAGNOSIS — K219 Gastro-esophageal reflux disease without esophagitis: Secondary | ICD-10-CM | POA: Diagnosis not present

## 2017-11-05 DIAGNOSIS — E785 Hyperlipidemia, unspecified: Secondary | ICD-10-CM | POA: Diagnosis not present

## 2017-11-05 DIAGNOSIS — E669 Obesity, unspecified: Secondary | ICD-10-CM | POA: Diagnosis not present

## 2017-11-05 DIAGNOSIS — F319 Bipolar disorder, unspecified: Secondary | ICD-10-CM | POA: Diagnosis not present

## 2017-11-18 DIAGNOSIS — Z87898 Personal history of other specified conditions: Secondary | ICD-10-CM | POA: Diagnosis not present

## 2017-11-18 DIAGNOSIS — F329 Major depressive disorder, single episode, unspecified: Secondary | ICD-10-CM | POA: Diagnosis not present

## 2017-12-04 DIAGNOSIS — H409 Unspecified glaucoma: Secondary | ICD-10-CM | POA: Diagnosis not present

## 2017-12-04 DIAGNOSIS — Z6830 Body mass index (BMI) 30.0-30.9, adult: Secondary | ICD-10-CM | POA: Diagnosis not present

## 2017-12-04 DIAGNOSIS — K5900 Constipation, unspecified: Secondary | ICD-10-CM | POA: Diagnosis not present

## 2017-12-04 DIAGNOSIS — E119 Type 2 diabetes mellitus without complications: Secondary | ICD-10-CM | POA: Diagnosis not present

## 2017-12-23 DIAGNOSIS — D509 Iron deficiency anemia, unspecified: Secondary | ICD-10-CM | POA: Diagnosis not present

## 2017-12-23 DIAGNOSIS — F319 Bipolar disorder, unspecified: Secondary | ICD-10-CM | POA: Diagnosis not present

## 2017-12-23 DIAGNOSIS — E119 Type 2 diabetes mellitus without complications: Secondary | ICD-10-CM | POA: Diagnosis not present

## 2017-12-23 DIAGNOSIS — E669 Obesity, unspecified: Secondary | ICD-10-CM | POA: Diagnosis not present

## 2017-12-25 DIAGNOSIS — E119 Type 2 diabetes mellitus without complications: Secondary | ICD-10-CM | POA: Diagnosis not present

## 2017-12-25 DIAGNOSIS — H2513 Age-related nuclear cataract, bilateral: Secondary | ICD-10-CM | POA: Diagnosis not present

## 2017-12-25 DIAGNOSIS — Z7984 Long term (current) use of oral hypoglycemic drugs: Secondary | ICD-10-CM | POA: Diagnosis not present

## 2017-12-25 DIAGNOSIS — H40013 Open angle with borderline findings, low risk, bilateral: Secondary | ICD-10-CM | POA: Diagnosis not present

## 2018-02-05 DIAGNOSIS — K5909 Other constipation: Secondary | ICD-10-CM | POA: Diagnosis not present

## 2018-02-05 DIAGNOSIS — Z800 Family history of malignant neoplasm of digestive organs: Secondary | ICD-10-CM | POA: Diagnosis not present

## 2018-02-05 DIAGNOSIS — M6289 Other specified disorders of muscle: Secondary | ICD-10-CM | POA: Diagnosis not present

## 2018-02-05 DIAGNOSIS — R109 Unspecified abdominal pain: Secondary | ICD-10-CM | POA: Diagnosis not present

## 2018-02-18 DIAGNOSIS — R194 Change in bowel habit: Secondary | ICD-10-CM | POA: Diagnosis not present

## 2018-02-18 DIAGNOSIS — Z5309 Procedure and treatment not carried out because of other contraindication: Secondary | ICD-10-CM | POA: Diagnosis not present

## 2018-02-19 DIAGNOSIS — Q438 Other specified congenital malformations of intestine: Secondary | ICD-10-CM | POA: Diagnosis not present

## 2018-02-19 DIAGNOSIS — R194 Change in bowel habit: Secondary | ICD-10-CM | POA: Diagnosis not present

## 2018-03-08 DIAGNOSIS — R109 Unspecified abdominal pain: Secondary | ICD-10-CM | POA: Diagnosis not present

## 2018-03-08 DIAGNOSIS — Z800 Family history of malignant neoplasm of digestive organs: Secondary | ICD-10-CM | POA: Diagnosis not present

## 2018-03-08 DIAGNOSIS — M6289 Other specified disorders of muscle: Secondary | ICD-10-CM | POA: Diagnosis not present

## 2018-03-08 DIAGNOSIS — K5909 Other constipation: Secondary | ICD-10-CM | POA: Diagnosis not present

## 2018-04-16 DIAGNOSIS — E119 Type 2 diabetes mellitus without complications: Secondary | ICD-10-CM | POA: Diagnosis not present

## 2018-04-16 DIAGNOSIS — E785 Hyperlipidemia, unspecified: Secondary | ICD-10-CM | POA: Diagnosis not present

## 2018-04-21 DIAGNOSIS — E119 Type 2 diabetes mellitus without complications: Secondary | ICD-10-CM | POA: Diagnosis not present

## 2018-04-21 DIAGNOSIS — Z1389 Encounter for screening for other disorder: Secondary | ICD-10-CM | POA: Diagnosis not present

## 2018-04-21 DIAGNOSIS — E669 Obesity, unspecified: Secondary | ICD-10-CM | POA: Diagnosis not present

## 2018-04-21 DIAGNOSIS — Z23 Encounter for immunization: Secondary | ICD-10-CM | POA: Diagnosis not present

## 2018-04-21 DIAGNOSIS — Z124 Encounter for screening for malignant neoplasm of cervix: Secondary | ICD-10-CM | POA: Diagnosis not present

## 2018-04-21 DIAGNOSIS — Z0001 Encounter for general adult medical examination with abnormal findings: Secondary | ICD-10-CM | POA: Diagnosis not present

## 2018-04-21 DIAGNOSIS — E785 Hyperlipidemia, unspecified: Secondary | ICD-10-CM | POA: Diagnosis not present

## 2018-04-30 DIAGNOSIS — Z1231 Encounter for screening mammogram for malignant neoplasm of breast: Secondary | ICD-10-CM | POA: Diagnosis not present

## 2019-06-10 DEATH — deceased
